# Patient Record
Sex: Male | Born: 1935 | Race: White | Hispanic: No | Marital: Married | Smoking: Never smoker
Health system: Southern US, Community
[De-identification: ages and names within clinical notes are randomized; demographics above are authoritative.]

## PROBLEM LIST (undated history)

## (undated) DIAGNOSIS — M47812 Spondylosis without myelopathy or radiculopathy, cervical region: Secondary | ICD-10-CM

## (undated) DIAGNOSIS — Z79891 Long term (current) use of opiate analgesic: Secondary | ICD-10-CM

## (undated) DIAGNOSIS — I2510 Atherosclerotic heart disease of native coronary artery without angina pectoris: Secondary | ICD-10-CM

## (undated) DIAGNOSIS — G894 Chronic pain syndrome: Secondary | ICD-10-CM

## (undated) DIAGNOSIS — IMO0001 Reserved for inherently not codable concepts without codable children: Secondary | ICD-10-CM

## (undated) DIAGNOSIS — I480 Paroxysmal atrial fibrillation: Secondary | ICD-10-CM

## (undated) DIAGNOSIS — E785 Hyperlipidemia, unspecified: Secondary | ICD-10-CM

## (undated) DIAGNOSIS — I4891 Unspecified atrial fibrillation: Secondary | ICD-10-CM

## (undated) DIAGNOSIS — M5030 Other cervical disc degeneration, unspecified cervical region: Secondary | ICD-10-CM

## (undated) DIAGNOSIS — D46A Refractory cytopenia with multilineage dysplasia: Secondary | ICD-10-CM

## (undated) DIAGNOSIS — D469 Myelodysplastic syndrome, unspecified: Secondary | ICD-10-CM

## (undated) DIAGNOSIS — I10 Essential (primary) hypertension: Secondary | ICD-10-CM

## (undated) DIAGNOSIS — D61818 Other pancytopenia: Secondary | ICD-10-CM

## (undated) HISTORY — DX: Myelodysplastic syndrome, unspecified: D46.9

## (undated) HISTORY — DX: Atherosclerotic heart disease of native coronary artery without angina pectoris: I25.10

## (undated) HISTORY — DX: Other cervical disc degeneration, unspecified cervical region: M50.30

## (undated) HISTORY — PX: CATARACT EXTRACTION: SUR2

## (undated) HISTORY — PX: HIP SURGERY: SHX245

## (undated) HISTORY — DX: Essential (primary) hypertension: I10

## (undated) HISTORY — DX: Hyperlipidemia, unspecified: E78.5

## (undated) HISTORY — DX: Chronic pain syndrome: G89.4

## (undated) HISTORY — DX: Other pancytopenia: D61.818

## (undated) HISTORY — DX: Paroxysmal atrial fibrillation: I48.0

## (undated) HISTORY — DX: Refractory cytopenia with multilineage dysplasia: D46.A

## (undated) HISTORY — PX: KNEE SURGERY: SHX244

## (undated) HISTORY — DX: Reserved for inherently not codable concepts without codable children: IMO0001

## (undated) HISTORY — DX: Unspecified atrial fibrillation: I48.91

## (undated) HISTORY — DX: Long term (current) use of opiate analgesic: Z79.891

## (undated) HISTORY — DX: Spondylosis without myelopathy or radiculopathy, cervical region: M47.812

## (undated) HISTORY — PX: CHOLECYSTECTOMY: SHX55

---

## 1997-01-03 HISTORY — PX: CARDIAC CATHETERIZATION: SHX172

## 2005-03-08 ENCOUNTER — Inpatient Hospital Stay (HOSPITAL_COMMUNITY): Admission: RE | Admit: 2005-03-08 | Discharge: 2005-03-11 | Payer: Self-pay | Admitting: Cardiovascular Disease

## 2005-03-09 ENCOUNTER — Ambulatory Visit: Payer: Self-pay | Admitting: Internal Medicine

## 2005-03-21 ENCOUNTER — Ambulatory Visit: Payer: Self-pay | Admitting: Oncology

## 2005-03-22 ENCOUNTER — Other Ambulatory Visit: Admission: RE | Admit: 2005-03-22 | Discharge: 2005-03-22 | Payer: Self-pay | Admitting: Oncology

## 2005-03-22 ENCOUNTER — Encounter (INDEPENDENT_AMBULATORY_CARE_PROVIDER_SITE_OTHER): Payer: Self-pay | Admitting: Oncology

## 2005-03-30 ENCOUNTER — Ambulatory Visit: Payer: Self-pay | Admitting: Hematology and Oncology

## 2005-05-09 ENCOUNTER — Ambulatory Visit: Payer: Self-pay | Admitting: Oncology

## 2005-06-28 ENCOUNTER — Ambulatory Visit: Payer: Self-pay | Admitting: Oncology

## 2005-07-27 ENCOUNTER — Ambulatory Visit: Payer: Self-pay | Admitting: Oncology

## 2005-08-18 ENCOUNTER — Ambulatory Visit: Payer: Self-pay | Admitting: Oncology

## 2005-10-10 ENCOUNTER — Ambulatory Visit: Payer: Self-pay | Admitting: Oncology

## 2005-12-05 ENCOUNTER — Ambulatory Visit: Payer: Self-pay | Admitting: Oncology

## 2005-12-09 ENCOUNTER — Ambulatory Visit: Payer: Self-pay | Admitting: Oncology

## 2006-01-30 ENCOUNTER — Ambulatory Visit: Payer: Self-pay | Admitting: Oncology

## 2006-06-20 ENCOUNTER — Other Ambulatory Visit: Admission: RE | Admit: 2006-06-20 | Discharge: 2006-06-20 | Payer: Self-pay | Admitting: Oncology

## 2006-06-20 ENCOUNTER — Encounter (INDEPENDENT_AMBULATORY_CARE_PROVIDER_SITE_OTHER): Payer: Self-pay | Admitting: Oncology

## 2006-07-13 ENCOUNTER — Ambulatory Visit: Payer: Self-pay | Admitting: Oncology

## 2006-09-29 ENCOUNTER — Ambulatory Visit: Payer: Self-pay | Admitting: Oncology

## 2007-03-04 HISTORY — PX: CARDIAC CATHETERIZATION: SHX172

## 2007-11-12 IMAGING — CR DG CHEST 2V
2 series · 2 of 2 positions shown · non-contrast
Comparison: none

CLINICAL DATA: Pre-cardiac catheterization.  Shortness of breath.  Chest tightness.  Abnormal EKG.  History of pneumonia in [REDACTED].  
 CHEST - 2 VIEW:

[view not recorded (1 of 2)]
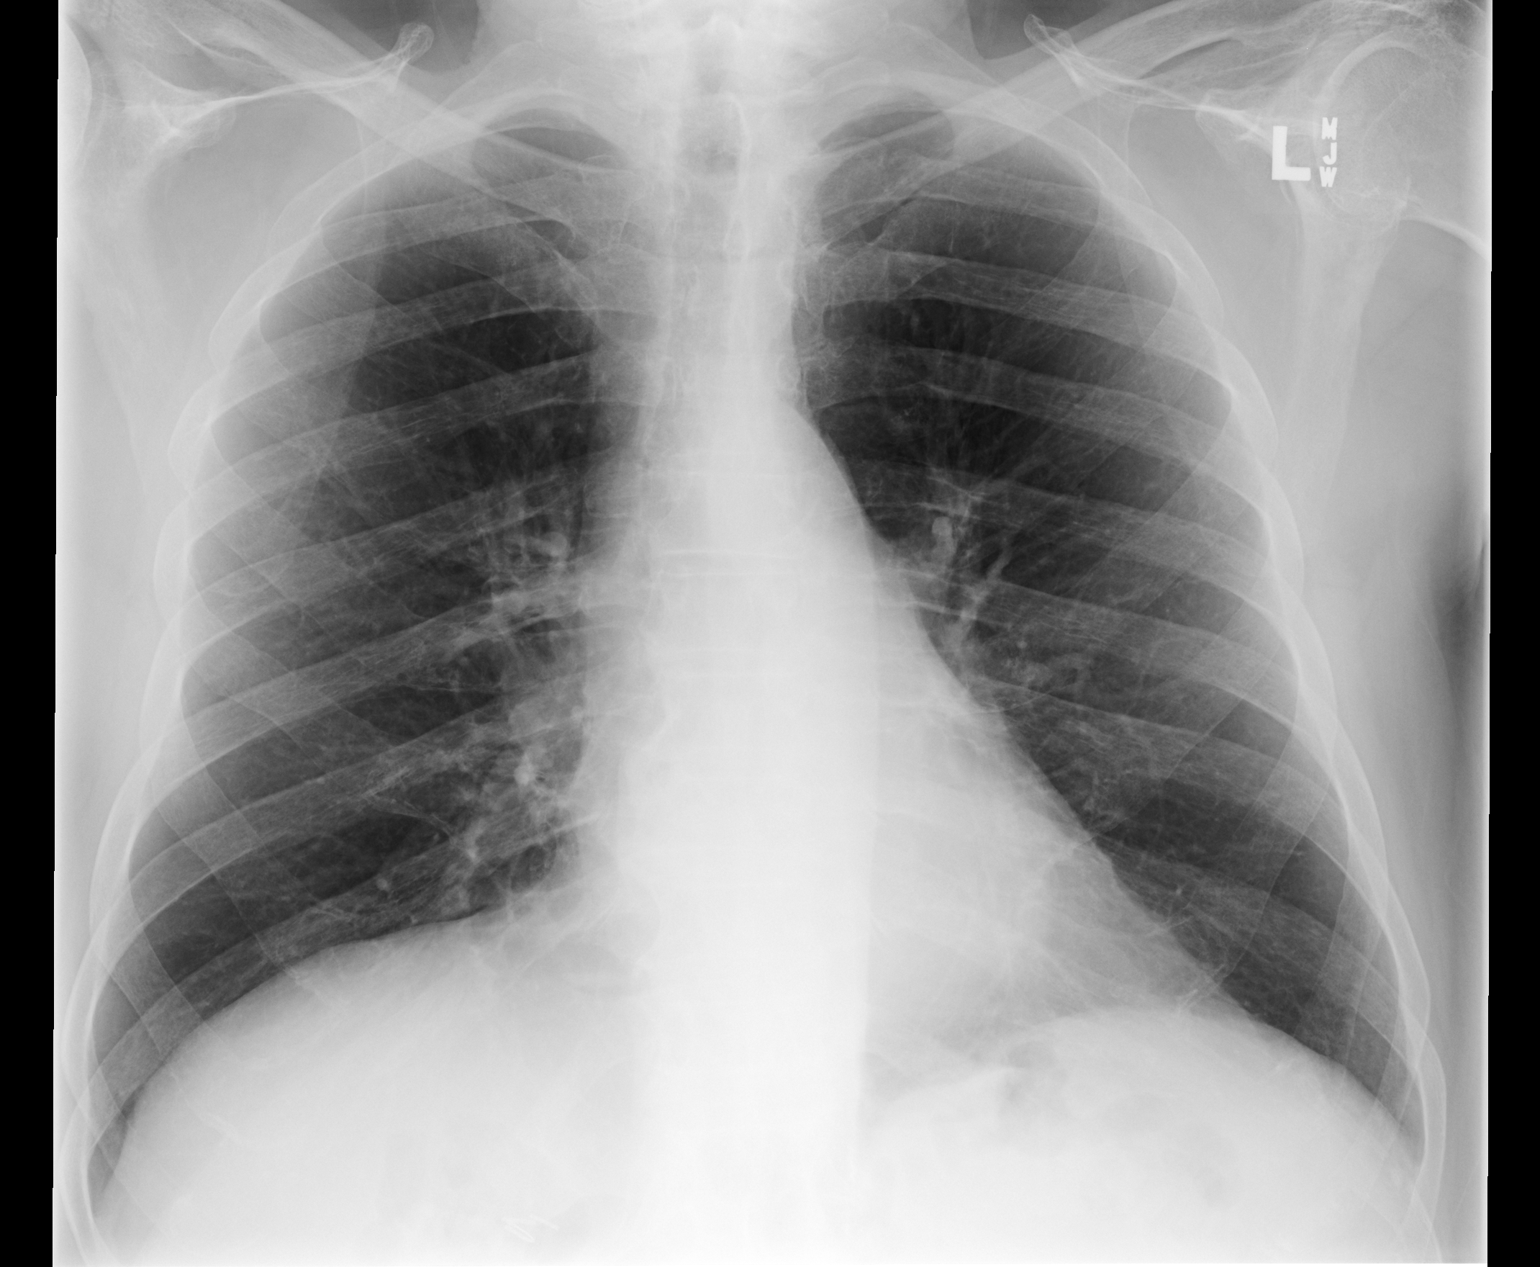

[view not recorded (2 of 2)]
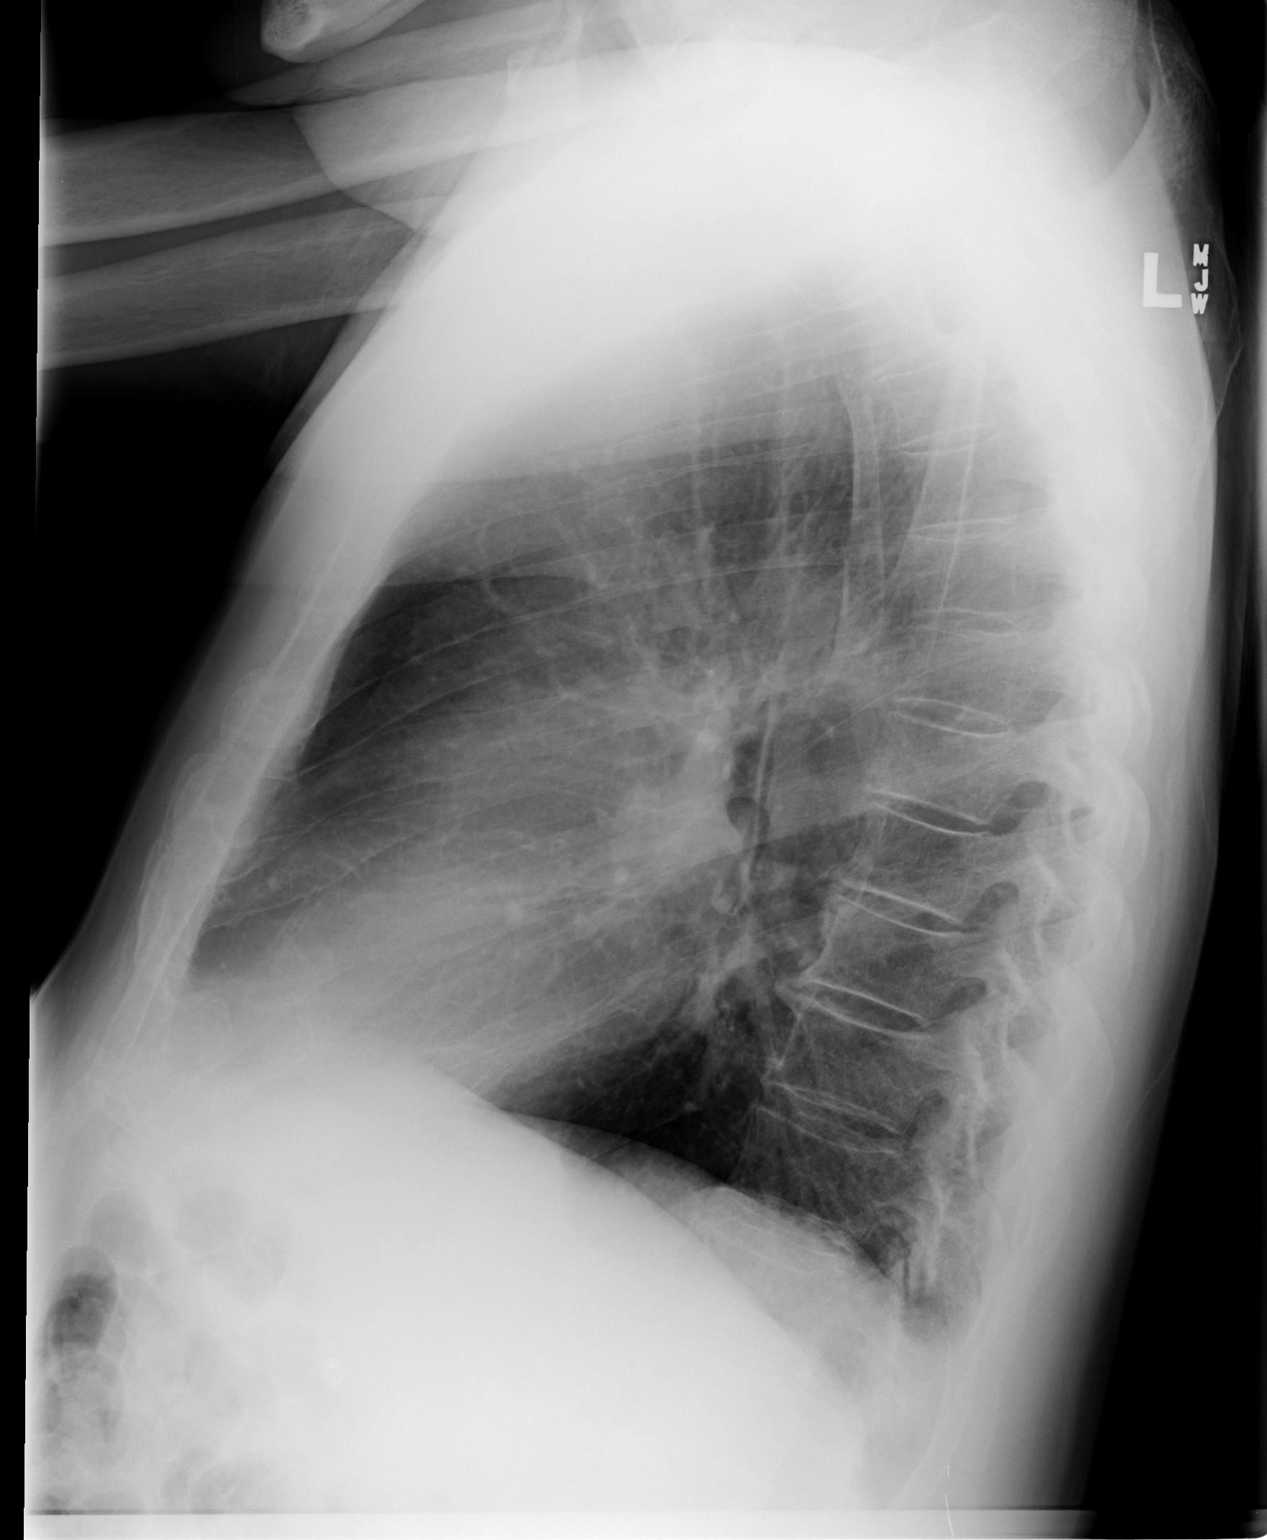

[2 of 2 positions shown; findings below may reference images not displayed]

FINDINGS: Midline trachea.  Heart size normal.  Mediastinal contours unremarkable.  Lungs clear.  No pleural effusion.  Mild thoracic spondylosis.
IMPRESSION: No acute cardiopulmonary disease.

## 2010-05-21 NOTE — Consult Note (Signed)
NAMEKIMOTHY, KISHIMOTO            ACCOUNT NO.:  [id number]   MEDICAL RECORD NO.:  [id number]          PATIENT TYPE:  INP   LOCATION:  NA                           FACILITY:  [facility]   PHYSICIAN:  Lauretta I. Odogwu, M.D.DATE OF BIRTH:  May 11, 1925   DATE OF CONSULTATION:  03/10/2005  DATE OF DISCHARGE:                                   CONSULTATION   REASON FOR CONSULTATION:  Patient with macrocytic anemia who is due to  undergo cardiac catheterization.   FINDINGS:  The patient is a 77 year old gentleman who was admitted routinely  for a cardiac catheterization.  He presented with syncopal episodes and  chest tightness.  An admitting head CT scan is negative.  The patient denied  a history of overt blood loss through his GI and GU tracts.  He has never  had bruising or petechiae.  He eats a well balanced diet.  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  His admission hemoglobin was 7.1 with an  MCV of 108.  He was subsequently transfused 2 units of packed red blood  cells with an increase of his hemoglobin to 10.2.  He denies a history of  previous transfusions.  The patient underwent an upper and lower endoscopy  on March 09, 2005, which was essentially negative.  Heme workup so far notes the following results: (Obtained prior to  transfusion);  Iron 188, TIBC 318, percent saturation 59%, ferritin 216,  vitamin B12 400.   PAST MEDICAL HISTORY:  1.  Hypertension.  2.  Coronary artery disease with myocardial infarction in 1999 status post      PCI with stents, cath in January 2006.  3.  History of degenerative joint disease.  4.  History of peptic ulcer disease in 2001.   MEDICATIONS:  Toprol XL, Lipitor, Lotensin, aspirin, nitroglycerin,  Claritin.   ALLERGIES:  The patient is allergic to PENICILLIN, MORPHINE, AND TETANUS.   SOCIAL HISTORY:  The patient is married with two children.  Denies history  of alcohol or tobacco use.   FAMILY HISTORY:  Negative for anemia or  oncologic malignancies.   REVIEW OF SYSTEMS:  CONSTITUTIONAL:  Denies fevers, chills, night sweats,  anorexia, or weight loss.  CARDIOVASCULAR:  Denies chest pain, PND,  orthopnea, ankle swelling.  RESPIRATORY:  Denies cough, hemoptysis, wheeze,  shortness of breath.  GI:  Denies nausea, vomiting, abdominal pain,  diarrhea, constipation, or melena.  GU:  Denies dysuria or hematuria.  SKIN:  Denies bruising or petechiae.  MUSCULOSKELETAL:  Admits to joint ache, but  denies muscle tenderness.  NEUROLOGICAL:  Denies headache, vision change,  extremity weakness.   PHYSICAL EXAMINATION:  GENERAL:  The patient is a well appearing, well nourished man in no  distress.  He is alert and oriented x 3.  VITAL SIGNS:  Pulse 86, blood pressure 120/70, temperature 99.2,  respirations 20, saturations 97% on room air.  HEENT:  Head is normocephalic, atraumatic.  Extraocular movements intact.  Sclerae anicteric.  Pupils equal, round, reactive to light.  Mouth moist  without ulcerations, thrush, or lesions.  There is no glossitis.  NECK:  Supple without thyromegaly and trachea was midline.  LUNGS:  Good air entry bilaterally and is clear to auscultation and  percussion.  HEART:  First and second heart sounds present with no added sounds or  murmurs.  ABDOMEN:  Soft, nontender.  There was no hepatosplenomegaly and bowel sounds  were present.  LYMPH NODES:  No palpable cervical, axillary, inguinal adenopathy.  EXTREMITIES:  No edema, pulses were present and symmetric.  NEUROLOGICAL:  No focal deficits.  Plantars downgoing.  Reflexes were  present and symmetrical.   LABORATORY DATA:  Please see above.  In addition, review of peripheral smear  demonstrated no schistocytes.  Red blood cells slightly enlarged.  White  blood cells and platelets were normal morphology.  CBC March 10, 2005, white  cell count 5.1, hemoglobin 10.2, hematocrit 28.7, platelets 317, MCV 100.  On admission, sodium 138, potassium  3.9, chloride 107, CO2 24, BUN 18,  creatinine 1.1, glucose 101, total bilirubin 1.1, alkaline phos 62, AST 24,  ALT 17, total protein 6.  TSH normal at 1.53.  Urinalysis negative.   IMPRESSION:  AND PLAN  9.  77 year old gentleman with no smoking history or alcohol use with      macrocytic anemia. He is vitamin B12 deficient (vitamin B12 is low      normal).  He may have early myelodysplastic syndrome.  Would therefore      recommend checking erythropoietin levels.  The patient may benefit from      vitamin B12, and will be given 1000 mcg subcu today. He will definitely      require a bone marrow biopsy and this can be arranged and performed as      an outpatient once he has been discharged.  2.  Coronary artery disease.  The patient is due for cardiac catheterization      in the morning.  Drugs such as Plavix, Angiostat, 2B-3A inhibitors are      not contraindicated in the setting of macrocytic anemia.  The patient      has been transfused with adequate increase in his hemoglobin and      hematocrit.   Thank you for allowing me to participate in the patient's care.  Please call  for questions.      Lauretta I. Odogwu, M.D.  Electronically Signed     LIO/MEDQ  D:  03/10/2005  T:  03/10/2005  Job:  [id number]

## 2010-05-21 NOTE — Cardiovascular Report (Signed)
[name], [name]            ACCOUNT NO.:  [id number]   MEDICAL RECORD NO.:  [id number]          PATIENT TYPE:  INP   LOCATION:  2033                         FACILITY:  [facility]   PHYSICIAN:  Lance [name], M.D.DATE OF BIRTH:  1925-04-18   DATE OF PROCEDURE:  03/11/2005  DATE OF DISCHARGE:  03/11/2005                              CARDIAC CATHETERIZATION   PROCEDURE:  Retrograde central aortic catheterization, selective coronary  angiography via Judkins technique, LV angiogram in RAO and LAO projection,  abdominal aortic angiogram midstream PA projection.   PROCEDURE:  The patient brought to the second floor CP Lab room number 7 in  a post absorptive state after 5 mg Valium p.o. premedication. Informed  consent was obtained from the patient's family to proceed with the  procedure.  The right groin prepped and draped in usual manner. 1% Xylocaine  was used for local anesthesia.  The RCFA was entered with a single anterior  puncture using an 18 thin wall needle with modified Seldinger technique and  a 0.035 Teflon coated J-tip guide-wire for insertion of a 6 [place] short  Daig sidearm sheath.  Catheterization was done with 6 French 4 cm taper  preformed Cordis, coronary, and pigtail catheters. LV angiogram was done in  the RAO and LAO projection, 25 mL at 14 mL per second, 20 mL at 12 mL per  second. Pullback pressure at the CA showed no gradient across the aortic  valve. Abdominal aortic angiogram was done with DSA and mid stream PA  projection, at 20 mL 20 mL per second. The catheter was removed, sidearm  sheath was flushed, cineangiograms were reviewed. The patient tolerated the  procedure well. He is transferred to the holding area for sheath removal  with pressure hemostasis in stable condition.   PRESSURES:  LV:  105/0; LVEDP 16 mmHg.  CA:  105/5, 50 mmHg.  No gradient  was aortic valve on catheter pullback.   Fluoroscopy showed 2+ calcification of the proximal third  of the LAD and  proximal portion of the circumflex that was nonobstructive. The bare metal  stent in the mid RCA was easily visualized.   The main left coronary was normal.   The LAD had irregularities in the proximal and mid third but had no  significant stenosis and coursed to the apex of the heart where it  bifurcated. It was a moderate size vessel.   The first diagonal arose before SP1, bifurcated, was thin, had  irregularities but no significant angiographic stenosis.   The second diagonal arose after SP1 at the junction of the proximal third of  the LAD, bifurcated, but had no significant stenosis.   There was a moderate sized optional diagonal vessel (trifurcation) and had  tandem 75-80 and 80-85% lesions in the midportion. It was tortuous in the  distal third and bifurcated with good flow.   The circumflex was nondominant and gave off a moderate sized distal marginal  and the distal proper circumflex and PABG branch, it bifurcated and it was  widely patent with no stenosis.   The right coronary was a dominant vessel.  The PDA  arose at the acute  margin.  The PLA bifurcated, gave off an AV node branch, and was normal.  The midportion had a previously placed bare metal stent with less than 20-  30% narrowing, excellent flow, and excellent residual lumen. The proximal  vessel had a conus left atrial and RV branch, all of which were normal.   Abdominal aortic angiogram midstream PA projection showed fairly normal  infrarenal abdominal aorta, single renal arteries, patent celiac and SMA and  IMA axis. The common iliacs had no significant stenosis. The hypogastrics  were intact bilaterally and proximal external iliacs appeared normal with  good runoff. There was no aneurysm or stenosis.  The renal arteries were  single and normal.   DISCUSSION:  Lance [name] is a 77 year old married father of two with three  grandchildren. He is a nonsmoker. He has known coronary disease dating  back  to 1999 when he had an out of hospital DMI. He underwent intervention on  January 08, 1997, for subtotal mid right coronary stenosis and thrombosis. He  had a bare metal 3.5/22 stent placed to the mid RCA and POBA of tandem PLA  lesions upgraded to a 3/5 balloon with good angiographic result. He had 50  and 80-90% tandem OD lesions at that time and was treated medically long  term with good LV function and minor inferior wall motion abnormality. He is  now retired from [employer] which his son does but he is active and he  has had recent dyspnea on exertion, exercise tolerance, and exertional  angina prompting admission to the hospital on March 08, 2005.  He was found  to have a macrocytic anemia with heme negative stools, no significant  history of GI bleeding or upper GI symptoms. A GI consultation was performed  since he has had a history of remote peptic ulcer. He had the essentially  upper and lower endoscopy. Hematology consultation was subsequently obtained  and it was felt that he had macrocytic anemia, probably not related to blood  loss. It was recommended he have a course of B12 100 mcg subcu monthly for  borderline low B12 and that he have outpatient bone marrow for further  evaluation to rule out any myelodysplastic syndrome. He was transfused 2  units in the hospital prior to his catheterization.   I recommend continued medical therapy of his residual coronary disease which  is essentially his optional diagonal disease. Continue workup of his  macrocytic anemia as outlined. Should he develop symptoms of angina, his  tandem optional diagonal lesions would appear to be amendable  angiographically to PCI if clinically indicated.   CATHETERIZATION DIAGNOSIS:  1.  Angina, probably secondary related to anemia.  2.  Macrocytic anemia, workup in progress.  3.  Coronary artery disease with prior out of hospital SED MI and PCI     January 08, 1997, mid RCA bare metal  stenting PLA and PLBA tandem      lesions, no restenosis on this restudy.  4.  Normal LV function.  5.  Normal renal arteries.  6.  Macrocytic anemia requiring transfusion.  7.  Degenerate joint disease.  8.  Remote history of peptic ulcer disease.  9.  Negative upper and lower endoscopy on this admission.      Lance [name], M.D.  Electronically Signed     RAW/MEDQ  D:  03/11/2005  T:  03/11/2005  Job:  [id number]   cc:   Dr. Ivor Costa I. Odogwu,  M.D.  Fax: [phone]   Wilhemina Bonito. Marina Goodell, M.D. [facility]  [address]  [place]  [place] [address]

## 2010-05-21 NOTE — Discharge Summary (Signed)
NAMEHUSSEIN, Lance [name]            ACCOUNT NO.:  [id number]   MEDICAL RECORD NO.:  [id number]          PATIENT TYPE:  INP   LOCATION:  2033                         FACILITY:  [facility]   PHYSICIAN:  Richard A. Alanda Amass, M.D.DATE OF BIRTH:  09-Nov-1925   DATE OF ADMISSION:  03/08/2005  DATE OF DISCHARGE:  03/11/2005                                 DISCHARGE SUMMARY   DISCHARGE DIAGNOSES:  1.  Chest pressure, unstable angina.  2.  Macrocytic anemia with acute anemia on admission receiving two units of      packed cells.  3.  Syncope with heart rate dropping to 39.  4.  Coronary artery disease, with history of previous stent, currently      stable coronary disease.  5.  Vitamin B12 deficiency.  6.  Sigmoid diverticulosis.  7.  History of degenerative joint disease.  8.  History of peptic ulcer disease.   DISCHARGE CONDITION:  Stable.   DISCHARGE MEDICATIONS:  1.  Zyrtec as needed.  2.  Lipitor 40 mg daily.  3.  Lotensin 20 mg daily.  4.  Aspirin 81 mg daily.  5.  Toprol XL 25 mg daily.  6.  B12 injection 1000 mg subcu monthly; take the vial of medications to Dr.      Erroll Luna for injection.  7.  Nitroglycerin sublingual p.r.n. chest pain.   DISCHARGE INSTRUCTIONS:  1.  Low-fat, low-salt diet.  2.  Wash right groin cath site with soap and water; to call if any bleeding,      swelling, or drainage.  3.  Follow up with Dr. Alanda Amass on March 25, 2005, at 10:30 a.m.  4.  Follow up with Dr. Manson Passey in [place], to call for that appointment.  5.  Have labwork done on Monday, March 14, 2005.  6.  Have monitor placement at 11 a.m. on the 2nd floor of Dr. Kandis Cocking      office.  7.  Follow up with hematology for bone marrow biopsy with Dr. Dalene Carrow.   HISTORY OF PRESENT ILLNESS:  A 77 year old male patient of Dr. Alanda Amass was  seen in the office on March 07, 2005, with plans for admission on March 08, 2005, for cardiac catheterization. He has a history of coronary artery  disease and in  the three to four months prior to admission he had dyspnea,  chest tightness and fullness particularly in the cold weather. He had not  used any nitroglycerin and did not consider this pain with now radiation. He  would rest and symptoms would resolve. He has associated dyspnea on exertion  which is also new and slightly progressive over the last several months,  anginal equivalent. When he walks his heart rate also increases.   In the office he denied GI bleed, syncope, or presyncope. He had a history  of bronchitis, questionable pneumonia, treated in January and in December.  He also had a recent rash prior to this admission that he was given Zyrtec  for.   EKG during the office visit was sinus tachycardia at 94, nonspecific ST  changes, and RSR prime in V1. Dr. Alanda Amass thought  the patient had  recurrent angina and due to his ACT lifestyle at 77 years of age and  significant comorbid problems he gave him Imdur and started Plavix, and  thought cardiac catheterization would be next the step. He was scheduled to  come in on March 08, 2005, and labwork was ordered. Also in the office he did  do a hemoccult which was negative.   On admitting labs hemoglobin was 7.1, hematocrit 19.1.  He was admitted,  placed on PPI, cardiac catheterization was held. GI consult was obtained.   PAST MEDICAL HISTORY:  1.  Positive for coronary artery disease. Remove out of hospital with BMI      with angioplasty and stenting of the mid RCA in January 1999 and PTCA of      the mid PLA lesion. He also had moderate disease at that time of the      lower diagonal and mild disease of the mid LAD, with four large diagonal      with mild inferior wall hypokinesis. He has done well. His last      Cardiolite was on January 23, 2004, and it did not reveal any ischemia.  2.  Degenerative joint disease.  3.  Peptic ulcer disease.  4.  Hypertension.   For family history, social history, and review of systems, see  H&P.   ALLERGIES:  MORPHINE, PENICILLIN, and TETANUS.   OUTPATIENT MEDICATIONS:  1.  Toprol 100 mg.  2.  Lipitor 40 mg daily.  3.  Aspirin 81 mg daily.  4.  Lotensin 20 mg daily.   PHYSICAL EXAMINATION:  VITAL SIGNS: At discharge blood pressure 98/54, pulse  in the 90s and stable,  GENERAL: Alert and oriented male in no acute distress.  SKIN: Warm and dry.  LUNGS: Clear.  HEART: S1 and S2. Regular rate and rhythm. Right groin cath site stable.   PROCEDURE:  1.  On March 10, 2005, EGD and colonoscopy by Dr. Marina Goodell. EGD was normal.      Colonoscopy revealed diverticulosis in the sigmoid colon.  2.  On March 11, 2005, left heart catheterization by Dr. Susa Griffins.      He did have residual disease. His intervention to the RCA with bare      metal stenting, no restenosis. Normal renal arteries, normal LV      function. He does have diffuse disease of his circumflex. Medical      therapy was recommended.   CONSULTATIONS:  1.  Dr. Marina Goodell, GI.  2.  Dr. Thad Ranger, neurology.  3.  Dr. Dalene Carrow, oncology.   HOSPITAL COURSE:  The patient was admitted by Dr. Alanda Amass. On arrival to  the hospital he was found to have a hemoglobin of 7.1. He was transfused two  units. The cardiac catheterization which had been planned for anginal  equivalent was postponed. Also, on the same day of admission he developed  sudden lightheadedness, he stated that he felt like his heart had stopped,  and his eyes were rolled back in his head. He was sitting in a chair so he  did not injure himself. Family was with him at the time.   Heart rate did drop down to 39 during his procedure. Therefore, his Toprol  was cut back.   A neurologic consult was obtained who felt it was more related to his  bradycardia and his anemia. A GI consult was obtained with Dr. Marina Goodell. The  patient underwent EGD and colonoscopy and no obvious GI source for  his anemia. Oncology consult was obtained and the patient will be scheduled  for  a bone marrow biopsy as an outpatient. The patient was stable though to  undergo cardiac catheterization on March 11, 2005,  which he did with results  as previously stated. The EF was 60%. The patient tolerated that procedure  and was ready for discharge on the evening of March 11, 2005, and was  discharged home. He would follow up as an outpatient.   LABORATORY DATA:  First he had a carotid duplex study on March 09, 2005, with  right ICA stenosis of 40% to 60%, left ICA stenosis of 40% to 60%, bilateral  vertebral artery flow was antegrade. EKG revealed sinus rhythm, no obvious  changes.   CT of the head revealed chronic changes of atrophy, atherosclerosis, and  microvascular ischemic disease. No acute findings. PA and lateral chest x-  ray with no acute cardiopulmonary process. Serum protein electrophoresis was  of normal pattern.   Hemoglobin on admission 7.1, hematocrit 19.9, WBC 3.9,  platelet count  358,000. Two units of packed cells given. At discharge hemoglobin 9.1,  hematocrit 26.2, WBC 4.2, and platelet count 336, 000.   Protime 14.1, INR 1.1, PTT 26.   Chemistry reveals sodium of 139, potassium 4.3, chloride 109, CO2 23,  glucose 126, BUN 18, creatinine 1.1, calcium 9. This remained fairly stable.  AST 24, ALT 17, ALP 62, total bilirubin 1.1.   Cardiac enzymes were negative.  CK of 67 and 107, MB of 1.3 to 1.7, troponin-  I of 0.01 to 0.02, negative MI.   TSH 1.532. Iron 188, TIBC 318, iron saturation 59%, UIBC 130. B12 400.  Folate greater than 20, ferritin 316. CEA 0.9, PSA 3.91. Erythropoietin was  elevated at 182. UA was clear.   The patient will follow up his instructions.      Darcella Gasman. [place], N.P.      Richard A. Alanda Amass, M.D.  Electronically Signed    LRI/MEDQ  D:  04/18/2005  T:  04/18/2005  Job:  [id number]   cc:   Gerlene Burdock A. Alanda Amass, M.D.  Fax: [phone]   Dr. Manson Passey  [facility]. Marina Goodell, M.D. [facility]  [address]  [place]  [place] [address]   Marolyn Hammock. Thad Ranger, M.D.  Fax: [phone]   Lauretta I. Odogwu, M.D.  Fax: [phone]

## 2011-02-28 DIAGNOSIS — D4620 Refractory anemia with excess of blasts, unspecified: Secondary | ICD-10-CM | POA: Diagnosis not present

## 2011-05-19 DIAGNOSIS — Z79899 Other long term (current) drug therapy: Secondary | ICD-10-CM | POA: Diagnosis not present

## 2011-05-19 DIAGNOSIS — E785 Hyperlipidemia, unspecified: Secondary | ICD-10-CM | POA: Diagnosis not present

## 2011-06-03 DIAGNOSIS — I739 Peripheral vascular disease, unspecified: Secondary | ICD-10-CM | POA: Diagnosis not present

## 2011-06-03 DIAGNOSIS — I2510 Atherosclerotic heart disease of native coronary artery without angina pectoris: Secondary | ICD-10-CM | POA: Diagnosis not present

## 2011-06-03 DIAGNOSIS — E782 Mixed hyperlipidemia: Secondary | ICD-10-CM | POA: Diagnosis not present

## 2011-06-09 DIAGNOSIS — I6529 Occlusion and stenosis of unspecified carotid artery: Secondary | ICD-10-CM | POA: Diagnosis not present

## 2011-06-09 DIAGNOSIS — I2510 Atherosclerotic heart disease of native coronary artery without angina pectoris: Secondary | ICD-10-CM | POA: Diagnosis not present

## 2011-07-01 DIAGNOSIS — H2589 Other age-related cataract: Secondary | ICD-10-CM | POA: Diagnosis not present

## 2011-07-18 DIAGNOSIS — S139XXA Sprain of joints and ligaments of unspecified parts of neck, initial encounter: Secondary | ICD-10-CM | POA: Diagnosis not present

## 2011-07-18 DIAGNOSIS — M9981 Other biomechanical lesions of cervical region: Secondary | ICD-10-CM | POA: Diagnosis not present

## 2011-07-19 DIAGNOSIS — M9981 Other biomechanical lesions of cervical region: Secondary | ICD-10-CM | POA: Diagnosis not present

## 2011-07-19 DIAGNOSIS — S139XXA Sprain of joints and ligaments of unspecified parts of neck, initial encounter: Secondary | ICD-10-CM | POA: Diagnosis not present

## 2011-07-20 DIAGNOSIS — S139XXA Sprain of joints and ligaments of unspecified parts of neck, initial encounter: Secondary | ICD-10-CM | POA: Diagnosis not present

## 2011-07-20 DIAGNOSIS — M9981 Other biomechanical lesions of cervical region: Secondary | ICD-10-CM | POA: Diagnosis not present

## 2011-07-21 DIAGNOSIS — S139XXA Sprain of joints and ligaments of unspecified parts of neck, initial encounter: Secondary | ICD-10-CM | POA: Diagnosis not present

## 2011-07-21 DIAGNOSIS — M9981 Other biomechanical lesions of cervical region: Secondary | ICD-10-CM | POA: Diagnosis not present

## 2011-07-25 DIAGNOSIS — S139XXA Sprain of joints and ligaments of unspecified parts of neck, initial encounter: Secondary | ICD-10-CM | POA: Diagnosis not present

## 2011-07-25 DIAGNOSIS — M9981 Other biomechanical lesions of cervical region: Secondary | ICD-10-CM | POA: Diagnosis not present

## 2011-07-28 DIAGNOSIS — M9981 Other biomechanical lesions of cervical region: Secondary | ICD-10-CM | POA: Diagnosis not present

## 2011-07-28 DIAGNOSIS — S139XXA Sprain of joints and ligaments of unspecified parts of neck, initial encounter: Secondary | ICD-10-CM | POA: Diagnosis not present

## 2011-08-01 DIAGNOSIS — S139XXA Sprain of joints and ligaments of unspecified parts of neck, initial encounter: Secondary | ICD-10-CM | POA: Diagnosis not present

## 2011-08-01 DIAGNOSIS — M9981 Other biomechanical lesions of cervical region: Secondary | ICD-10-CM | POA: Diagnosis not present

## 2011-08-04 DIAGNOSIS — M9981 Other biomechanical lesions of cervical region: Secondary | ICD-10-CM | POA: Diagnosis not present

## 2011-08-04 DIAGNOSIS — S139XXA Sprain of joints and ligaments of unspecified parts of neck, initial encounter: Secondary | ICD-10-CM | POA: Diagnosis not present

## 2011-08-10 DIAGNOSIS — M9981 Other biomechanical lesions of cervical region: Secondary | ICD-10-CM | POA: Diagnosis not present

## 2011-08-10 DIAGNOSIS — S139XXA Sprain of joints and ligaments of unspecified parts of neck, initial encounter: Secondary | ICD-10-CM | POA: Diagnosis not present

## 2011-08-15 DIAGNOSIS — Z23 Encounter for immunization: Secondary | ICD-10-CM | POA: Diagnosis not present

## 2011-08-15 DIAGNOSIS — M79609 Pain in unspecified limb: Secondary | ICD-10-CM | POA: Diagnosis not present

## 2011-08-15 DIAGNOSIS — M9981 Other biomechanical lesions of cervical region: Secondary | ICD-10-CM | POA: Diagnosis not present

## 2011-08-15 DIAGNOSIS — S139XXA Sprain of joints and ligaments of unspecified parts of neck, initial encounter: Secondary | ICD-10-CM | POA: Diagnosis not present

## 2011-08-19 DIAGNOSIS — M79609 Pain in unspecified limb: Secondary | ICD-10-CM | POA: Diagnosis not present

## 2011-08-22 DIAGNOSIS — M9981 Other biomechanical lesions of cervical region: Secondary | ICD-10-CM | POA: Diagnosis not present

## 2011-08-22 DIAGNOSIS — S139XXA Sprain of joints and ligaments of unspecified parts of neck, initial encounter: Secondary | ICD-10-CM | POA: Diagnosis not present

## 2011-08-29 DIAGNOSIS — S139XXA Sprain of joints and ligaments of unspecified parts of neck, initial encounter: Secondary | ICD-10-CM | POA: Diagnosis not present

## 2011-08-29 DIAGNOSIS — M9981 Other biomechanical lesions of cervical region: Secondary | ICD-10-CM | POA: Diagnosis not present

## 2011-09-02 DIAGNOSIS — D4620 Refractory anemia with excess of blasts, unspecified: Secondary | ICD-10-CM | POA: Diagnosis not present

## 2011-09-12 DIAGNOSIS — S139XXA Sprain of joints and ligaments of unspecified parts of neck, initial encounter: Secondary | ICD-10-CM | POA: Diagnosis not present

## 2011-09-12 DIAGNOSIS — M999 Biomechanical lesion, unspecified: Secondary | ICD-10-CM | POA: Diagnosis not present

## 2011-09-12 DIAGNOSIS — M9981 Other biomechanical lesions of cervical region: Secondary | ICD-10-CM | POA: Diagnosis not present

## 2011-09-26 DIAGNOSIS — M999 Biomechanical lesion, unspecified: Secondary | ICD-10-CM | POA: Diagnosis not present

## 2011-09-26 DIAGNOSIS — M9981 Other biomechanical lesions of cervical region: Secondary | ICD-10-CM | POA: Diagnosis not present

## 2011-09-26 DIAGNOSIS — S139XXA Sprain of joints and ligaments of unspecified parts of neck, initial encounter: Secondary | ICD-10-CM | POA: Diagnosis not present

## 2011-09-30 DIAGNOSIS — L84 Corns and callosities: Secondary | ICD-10-CM | POA: Diagnosis not present

## 2011-09-30 DIAGNOSIS — B359 Dermatophytosis, unspecified: Secondary | ICD-10-CM | POA: Diagnosis not present

## 2011-09-30 DIAGNOSIS — J209 Acute bronchitis, unspecified: Secondary | ICD-10-CM | POA: Diagnosis not present

## 2011-09-30 DIAGNOSIS — L02619 Cutaneous abscess of unspecified foot: Secondary | ICD-10-CM | POA: Diagnosis not present

## 2011-09-30 DIAGNOSIS — L03039 Cellulitis of unspecified toe: Secondary | ICD-10-CM | POA: Diagnosis not present

## 2011-09-30 DIAGNOSIS — M2040 Other hammer toe(s) (acquired), unspecified foot: Secondary | ICD-10-CM | POA: Diagnosis not present

## 2011-10-03 DIAGNOSIS — M999 Biomechanical lesion, unspecified: Secondary | ICD-10-CM | POA: Diagnosis not present

## 2011-10-03 DIAGNOSIS — S139XXA Sprain of joints and ligaments of unspecified parts of neck, initial encounter: Secondary | ICD-10-CM | POA: Diagnosis not present

## 2011-10-03 DIAGNOSIS — M9981 Other biomechanical lesions of cervical region: Secondary | ICD-10-CM | POA: Diagnosis not present

## 2011-10-04 DIAGNOSIS — M999 Biomechanical lesion, unspecified: Secondary | ICD-10-CM | POA: Diagnosis not present

## 2011-10-04 DIAGNOSIS — S139XXA Sprain of joints and ligaments of unspecified parts of neck, initial encounter: Secondary | ICD-10-CM | POA: Diagnosis not present

## 2011-10-04 DIAGNOSIS — Z23 Encounter for immunization: Secondary | ICD-10-CM | POA: Diagnosis not present

## 2011-10-04 DIAGNOSIS — M9981 Other biomechanical lesions of cervical region: Secondary | ICD-10-CM | POA: Diagnosis not present

## 2011-10-05 DIAGNOSIS — L84 Corns and callosities: Secondary | ICD-10-CM | POA: Diagnosis not present

## 2011-10-05 DIAGNOSIS — S139XXA Sprain of joints and ligaments of unspecified parts of neck, initial encounter: Secondary | ICD-10-CM | POA: Diagnosis not present

## 2011-10-05 DIAGNOSIS — M9981 Other biomechanical lesions of cervical region: Secondary | ICD-10-CM | POA: Diagnosis not present

## 2011-10-05 DIAGNOSIS — M2040 Other hammer toe(s) (acquired), unspecified foot: Secondary | ICD-10-CM | POA: Diagnosis not present

## 2011-10-05 DIAGNOSIS — M2010 Hallux valgus (acquired), unspecified foot: Secondary | ICD-10-CM | POA: Diagnosis not present

## 2011-10-05 DIAGNOSIS — M999 Biomechanical lesion, unspecified: Secondary | ICD-10-CM | POA: Diagnosis not present

## 2011-10-06 DIAGNOSIS — M9981 Other biomechanical lesions of cervical region: Secondary | ICD-10-CM | POA: Diagnosis not present

## 2011-10-06 DIAGNOSIS — S139XXA Sprain of joints and ligaments of unspecified parts of neck, initial encounter: Secondary | ICD-10-CM | POA: Diagnosis not present

## 2011-10-06 DIAGNOSIS — M999 Biomechanical lesion, unspecified: Secondary | ICD-10-CM | POA: Diagnosis not present

## 2011-10-10 DIAGNOSIS — S139XXA Sprain of joints and ligaments of unspecified parts of neck, initial encounter: Secondary | ICD-10-CM | POA: Diagnosis not present

## 2011-10-10 DIAGNOSIS — M9981 Other biomechanical lesions of cervical region: Secondary | ICD-10-CM | POA: Diagnosis not present

## 2011-10-10 DIAGNOSIS — M999 Biomechanical lesion, unspecified: Secondary | ICD-10-CM | POA: Diagnosis not present

## 2011-10-13 DIAGNOSIS — S139XXA Sprain of joints and ligaments of unspecified parts of neck, initial encounter: Secondary | ICD-10-CM | POA: Diagnosis not present

## 2011-10-13 DIAGNOSIS — M999 Biomechanical lesion, unspecified: Secondary | ICD-10-CM | POA: Diagnosis not present

## 2011-10-13 DIAGNOSIS — M9981 Other biomechanical lesions of cervical region: Secondary | ICD-10-CM | POA: Diagnosis not present

## 2011-10-17 DIAGNOSIS — M9981 Other biomechanical lesions of cervical region: Secondary | ICD-10-CM | POA: Diagnosis not present

## 2011-10-17 DIAGNOSIS — M999 Biomechanical lesion, unspecified: Secondary | ICD-10-CM | POA: Diagnosis not present

## 2011-10-17 DIAGNOSIS — S139XXA Sprain of joints and ligaments of unspecified parts of neck, initial encounter: Secondary | ICD-10-CM | POA: Diagnosis not present

## 2011-10-20 DIAGNOSIS — S139XXA Sprain of joints and ligaments of unspecified parts of neck, initial encounter: Secondary | ICD-10-CM | POA: Diagnosis not present

## 2011-10-20 DIAGNOSIS — M9981 Other biomechanical lesions of cervical region: Secondary | ICD-10-CM | POA: Diagnosis not present

## 2011-10-20 DIAGNOSIS — M999 Biomechanical lesion, unspecified: Secondary | ICD-10-CM | POA: Diagnosis not present

## 2011-10-26 DIAGNOSIS — S139XXA Sprain of joints and ligaments of unspecified parts of neck, initial encounter: Secondary | ICD-10-CM | POA: Diagnosis not present

## 2011-10-26 DIAGNOSIS — M999 Biomechanical lesion, unspecified: Secondary | ICD-10-CM | POA: Diagnosis not present

## 2011-10-26 DIAGNOSIS — M9981 Other biomechanical lesions of cervical region: Secondary | ICD-10-CM | POA: Diagnosis not present

## 2011-11-02 DIAGNOSIS — M999 Biomechanical lesion, unspecified: Secondary | ICD-10-CM | POA: Diagnosis not present

## 2011-11-02 DIAGNOSIS — M9981 Other biomechanical lesions of cervical region: Secondary | ICD-10-CM | POA: Diagnosis not present

## 2011-11-02 DIAGNOSIS — S139XXA Sprain of joints and ligaments of unspecified parts of neck, initial encounter: Secondary | ICD-10-CM | POA: Diagnosis not present

## 2011-11-09 DIAGNOSIS — M999 Biomechanical lesion, unspecified: Secondary | ICD-10-CM | POA: Diagnosis not present

## 2011-11-09 DIAGNOSIS — S139XXA Sprain of joints and ligaments of unspecified parts of neck, initial encounter: Secondary | ICD-10-CM | POA: Diagnosis not present

## 2011-11-09 DIAGNOSIS — M9981 Other biomechanical lesions of cervical region: Secondary | ICD-10-CM | POA: Diagnosis not present

## 2011-11-17 DIAGNOSIS — I2510 Atherosclerotic heart disease of native coronary artery without angina pectoris: Secondary | ICD-10-CM | POA: Diagnosis not present

## 2011-11-17 DIAGNOSIS — E782 Mixed hyperlipidemia: Secondary | ICD-10-CM | POA: Diagnosis not present

## 2011-12-23 ENCOUNTER — Other Ambulatory Visit (HOSPITAL_COMMUNITY): Payer: Self-pay | Admitting: Cardiovascular Disease

## 2011-12-23 DIAGNOSIS — R011 Cardiac murmur, unspecified: Secondary | ICD-10-CM

## 2012-01-06 ENCOUNTER — Ambulatory Visit (HOSPITAL_COMMUNITY)
Admission: RE | Admit: 2012-01-06 | Discharge: 2012-01-06 | Disposition: A | Discharge status: Home / Self Care | Payer: Medicare Other | Source: Ambulatory Visit | Attending: Cardiovascular Disease | Admitting: Cardiovascular Disease

## 2012-01-06 DIAGNOSIS — I252 Old myocardial infarction: Secondary | ICD-10-CM | POA: Diagnosis not present

## 2012-01-06 DIAGNOSIS — I2510 Atherosclerotic heart disease of native coronary artery without angina pectoris: Secondary | ICD-10-CM | POA: Diagnosis not present

## 2012-01-06 DIAGNOSIS — I379 Nonrheumatic pulmonary valve disorder, unspecified: Secondary | ICD-10-CM | POA: Insufficient documentation

## 2012-01-06 DIAGNOSIS — R011 Cardiac murmur, unspecified: Secondary | ICD-10-CM | POA: Diagnosis not present

## 2012-01-06 DIAGNOSIS — I079 Rheumatic tricuspid valve disease, unspecified: Secondary | ICD-10-CM | POA: Insufficient documentation

## 2012-01-06 DIAGNOSIS — I080 Rheumatic disorders of both mitral and aortic valves: Secondary | ICD-10-CM | POA: Insufficient documentation

## 2012-01-06 DIAGNOSIS — E785 Hyperlipidemia, unspecified: Secondary | ICD-10-CM | POA: Diagnosis not present

## 2012-01-06 NOTE — Progress Notes (Signed)
[facility]   2D echo completed 01/06/2012.   Cindy Samiyyah Moffa, RDCS   

## 2012-01-09 DIAGNOSIS — I10 Essential (primary) hypertension: Secondary | ICD-10-CM | POA: Diagnosis not present

## 2012-01-09 DIAGNOSIS — D469 Myelodysplastic syndrome, unspecified: Secondary | ICD-10-CM | POA: Diagnosis not present

## 2012-01-09 DIAGNOSIS — E785 Hyperlipidemia, unspecified: Secondary | ICD-10-CM | POA: Diagnosis not present

## 2012-01-09 DIAGNOSIS — I4891 Unspecified atrial fibrillation: Secondary | ICD-10-CM | POA: Diagnosis not present

## 2012-01-09 DIAGNOSIS — Z9861 Coronary angioplasty status: Secondary | ICD-10-CM | POA: Diagnosis not present

## 2012-01-09 DIAGNOSIS — Z7982 Long term (current) use of aspirin: Secondary | ICD-10-CM | POA: Diagnosis not present

## 2012-01-09 DIAGNOSIS — R002 Palpitations: Secondary | ICD-10-CM | POA: Diagnosis not present

## 2012-01-09 DIAGNOSIS — I2510 Atherosclerotic heart disease of native coronary artery without angina pectoris: Secondary | ICD-10-CM | POA: Diagnosis not present

## 2012-01-09 DIAGNOSIS — I252 Old myocardial infarction: Secondary | ICD-10-CM | POA: Diagnosis not present

## 2012-01-09 DIAGNOSIS — Z23 Encounter for immunization: Secondary | ICD-10-CM | POA: Diagnosis not present

## 2012-01-09 DIAGNOSIS — E876 Hypokalemia: Secondary | ICD-10-CM | POA: Diagnosis not present

## 2012-01-13 DIAGNOSIS — I4891 Unspecified atrial fibrillation: Secondary | ICD-10-CM | POA: Diagnosis not present

## 2012-01-13 DIAGNOSIS — R791 Abnormal coagulation profile: Secondary | ICD-10-CM | POA: Diagnosis not present

## 2012-01-13 DIAGNOSIS — I495 Sick sinus syndrome: Secondary | ICD-10-CM | POA: Diagnosis not present

## 2012-01-13 DIAGNOSIS — I2510 Atherosclerotic heart disease of native coronary artery without angina pectoris: Secondary | ICD-10-CM | POA: Diagnosis not present

## 2012-01-17 ENCOUNTER — Telehealth: Payer: Self-pay | Admitting: *Deleted

## 2012-01-17 NOTE — Telephone Encounter (Signed)
Received office notes from [facility].  Faxed notes to [facility] as pt is established with [facility].

## 2012-01-18 DIAGNOSIS — M542 Cervicalgia: Secondary | ICD-10-CM | POA: Diagnosis not present

## 2012-01-18 DIAGNOSIS — R6889 Other general symptoms and signs: Secondary | ICD-10-CM | POA: Diagnosis not present

## 2012-01-18 DIAGNOSIS — I4891 Unspecified atrial fibrillation: Secondary | ICD-10-CM | POA: Diagnosis not present

## 2012-01-18 DIAGNOSIS — R5383 Other fatigue: Secondary | ICD-10-CM | POA: Diagnosis not present

## 2012-01-18 DIAGNOSIS — R5381 Other malaise: Secondary | ICD-10-CM | POA: Diagnosis not present

## 2012-01-20 DIAGNOSIS — R791 Abnormal coagulation profile: Secondary | ICD-10-CM | POA: Diagnosis not present

## 2012-01-24 DIAGNOSIS — M5030 Other cervical disc degeneration, unspecified cervical region: Secondary | ICD-10-CM | POA: Diagnosis not present

## 2012-01-30 DIAGNOSIS — Z7901 Long term (current) use of anticoagulants: Secondary | ICD-10-CM | POA: Diagnosis not present

## 2012-01-30 DIAGNOSIS — R791 Abnormal coagulation profile: Secondary | ICD-10-CM | POA: Diagnosis not present

## 2012-01-30 DIAGNOSIS — L2089 Other atopic dermatitis: Secondary | ICD-10-CM | POA: Diagnosis not present

## 2012-01-30 DIAGNOSIS — M5030 Other cervical disc degeneration, unspecified cervical region: Secondary | ICD-10-CM | POA: Diagnosis not present

## 2012-02-06 DIAGNOSIS — M5030 Other cervical disc degeneration, unspecified cervical region: Secondary | ICD-10-CM | POA: Diagnosis not present

## 2012-02-06 DIAGNOSIS — Z79899 Other long term (current) drug therapy: Secondary | ICD-10-CM | POA: Diagnosis not present

## 2012-02-06 DIAGNOSIS — M47812 Spondylosis without myelopathy or radiculopathy, cervical region: Secondary | ICD-10-CM | POA: Diagnosis not present

## 2012-02-06 DIAGNOSIS — G894 Chronic pain syndrome: Secondary | ICD-10-CM | POA: Diagnosis not present

## 2012-02-10 DIAGNOSIS — Z7901 Long term (current) use of anticoagulants: Secondary | ICD-10-CM | POA: Diagnosis not present

## 2012-02-23 DIAGNOSIS — R791 Abnormal coagulation profile: Secondary | ICD-10-CM | POA: Diagnosis not present

## 2012-03-07 DIAGNOSIS — M5030 Other cervical disc degeneration, unspecified cervical region: Secondary | ICD-10-CM | POA: Diagnosis not present

## 2012-03-07 DIAGNOSIS — M5412 Radiculopathy, cervical region: Secondary | ICD-10-CM | POA: Diagnosis not present

## 2012-03-07 DIAGNOSIS — Z7901 Long term (current) use of anticoagulants: Secondary | ICD-10-CM | POA: Diagnosis not present

## 2012-03-13 DIAGNOSIS — D4620 Refractory anemia with excess of blasts, unspecified: Secondary | ICD-10-CM | POA: Diagnosis not present

## 2012-03-15 DIAGNOSIS — M5030 Other cervical disc degeneration, unspecified cervical region: Secondary | ICD-10-CM | POA: Diagnosis not present

## 2012-03-15 DIAGNOSIS — M47812 Spondylosis without myelopathy or radiculopathy, cervical region: Secondary | ICD-10-CM | POA: Diagnosis not present

## 2012-03-15 DIAGNOSIS — G894 Chronic pain syndrome: Secondary | ICD-10-CM | POA: Diagnosis not present

## 2012-03-16 DIAGNOSIS — R791 Abnormal coagulation profile: Secondary | ICD-10-CM | POA: Diagnosis not present

## 2012-03-21 DIAGNOSIS — Z7901 Long term (current) use of anticoagulants: Secondary | ICD-10-CM | POA: Diagnosis not present

## 2012-03-28 DIAGNOSIS — M542 Cervicalgia: Secondary | ICD-10-CM | POA: Diagnosis not present

## 2012-03-28 DIAGNOSIS — Z7901 Long term (current) use of anticoagulants: Secondary | ICD-10-CM | POA: Diagnosis not present

## 2012-03-28 DIAGNOSIS — G894 Chronic pain syndrome: Secondary | ICD-10-CM | POA: Diagnosis not present

## 2012-03-28 DIAGNOSIS — G8929 Other chronic pain: Secondary | ICD-10-CM | POA: Diagnosis not present

## 2012-04-04 DIAGNOSIS — Z7901 Long term (current) use of anticoagulants: Secondary | ICD-10-CM | POA: Diagnosis not present

## 2012-04-17 DIAGNOSIS — R791 Abnormal coagulation profile: Secondary | ICD-10-CM | POA: Diagnosis not present

## 2012-04-19 DIAGNOSIS — I10 Essential (primary) hypertension: Secondary | ICD-10-CM | POA: Diagnosis not present

## 2012-04-19 DIAGNOSIS — E782 Mixed hyperlipidemia: Secondary | ICD-10-CM | POA: Diagnosis not present

## 2012-04-19 DIAGNOSIS — I2510 Atherosclerotic heart disease of native coronary artery without angina pectoris: Secondary | ICD-10-CM | POA: Diagnosis not present

## 2012-04-24 DIAGNOSIS — E785 Hyperlipidemia, unspecified: Secondary | ICD-10-CM | POA: Diagnosis not present

## 2012-04-24 DIAGNOSIS — J069 Acute upper respiratory infection, unspecified: Secondary | ICD-10-CM | POA: Diagnosis not present

## 2012-04-24 DIAGNOSIS — I10 Essential (primary) hypertension: Secondary | ICD-10-CM | POA: Diagnosis not present

## 2012-04-24 DIAGNOSIS — I4891 Unspecified atrial fibrillation: Secondary | ICD-10-CM | POA: Diagnosis not present

## 2012-04-24 DIAGNOSIS — R062 Wheezing: Secondary | ICD-10-CM | POA: Diagnosis not present

## 2012-04-24 DIAGNOSIS — M5030 Other cervical disc degeneration, unspecified cervical region: Secondary | ICD-10-CM | POA: Diagnosis not present

## 2012-04-27 ENCOUNTER — Telehealth (HOSPITAL_COMMUNITY): Payer: Self-pay | Admitting: *Deleted

## 2012-05-02 DIAGNOSIS — R791 Abnormal coagulation profile: Secondary | ICD-10-CM | POA: Diagnosis not present

## 2012-05-16 ENCOUNTER — Other Ambulatory Visit: Payer: Self-pay | Admitting: *Deleted

## 2012-05-16 DIAGNOSIS — R791 Abnormal coagulation profile: Secondary | ICD-10-CM | POA: Diagnosis not present

## 2012-05-16 MED ORDER — ATORVASTATIN CALCIUM 40 MG PO TABS: 40.0000 mg | ORAL_TABLET | Freq: Every day | ORAL | Status: DC

## 2012-05-22 ENCOUNTER — Other Ambulatory Visit: Payer: Self-pay | Admitting: *Deleted

## 2012-05-22 MED ORDER — ATORVASTATIN CALCIUM 40 MG PO TABS: 40.0000 mg | ORAL_TABLET | Freq: Every day | ORAL | Status: DC

## 2012-05-22 NOTE — Telephone Encounter (Signed)
rx printed and faxed manually to [facility]

## 2012-05-23 DIAGNOSIS — M129 Arthropathy, unspecified: Secondary | ICD-10-CM | POA: Diagnosis not present

## 2012-05-23 DIAGNOSIS — M542 Cervicalgia: Secondary | ICD-10-CM | POA: Diagnosis not present

## 2012-05-23 DIAGNOSIS — M47812 Spondylosis without myelopathy or radiculopathy, cervical region: Secondary | ICD-10-CM | POA: Diagnosis not present

## 2012-05-30 DIAGNOSIS — Z7901 Long term (current) use of anticoagulants: Secondary | ICD-10-CM | POA: Diagnosis not present

## 2012-06-13 DIAGNOSIS — Z7901 Long term (current) use of anticoagulants: Secondary | ICD-10-CM | POA: Diagnosis not present

## 2012-06-27 DIAGNOSIS — Z7901 Long term (current) use of anticoagulants: Secondary | ICD-10-CM | POA: Diagnosis not present

## 2012-07-02 DIAGNOSIS — H1030 Unspecified acute conjunctivitis, unspecified eye: Secondary | ICD-10-CM | POA: Diagnosis not present

## 2012-07-11 DIAGNOSIS — Z7901 Long term (current) use of anticoagulants: Secondary | ICD-10-CM | POA: Diagnosis not present

## 2012-08-08 DIAGNOSIS — Z7901 Long term (current) use of anticoagulants: Secondary | ICD-10-CM | POA: Diagnosis not present

## 2012-08-24 DIAGNOSIS — Z79899 Other long term (current) drug therapy: Secondary | ICD-10-CM | POA: Diagnosis not present

## 2012-08-24 DIAGNOSIS — I10 Essential (primary) hypertension: Secondary | ICD-10-CM | POA: Diagnosis not present

## 2012-08-24 DIAGNOSIS — E785 Hyperlipidemia, unspecified: Secondary | ICD-10-CM | POA: Diagnosis not present

## 2012-08-24 DIAGNOSIS — I4891 Unspecified atrial fibrillation: Secondary | ICD-10-CM | POA: Diagnosis not present

## 2012-08-31 DIAGNOSIS — E782 Mixed hyperlipidemia: Secondary | ICD-10-CM | POA: Diagnosis not present

## 2012-08-31 DIAGNOSIS — Z7902 Long term (current) use of antithrombotics/antiplatelets: Secondary | ICD-10-CM | POA: Diagnosis not present

## 2012-08-31 DIAGNOSIS — I2510 Atherosclerotic heart disease of native coronary artery without angina pectoris: Secondary | ICD-10-CM | POA: Diagnosis not present

## 2012-08-31 DIAGNOSIS — I495 Sick sinus syndrome: Secondary | ICD-10-CM | POA: Diagnosis not present

## 2012-09-13 DIAGNOSIS — D469 Myelodysplastic syndrome, unspecified: Secondary | ICD-10-CM | POA: Diagnosis not present

## 2012-09-27 ENCOUNTER — Encounter: Payer: Self-pay | Admitting: Cardiovascular Disease

## 2012-10-15 DIAGNOSIS — Z23 Encounter for immunization: Secondary | ICD-10-CM | POA: Diagnosis not present

## 2012-11-26 DIAGNOSIS — R209 Unspecified disturbances of skin sensation: Secondary | ICD-10-CM | POA: Diagnosis not present

## 2012-11-26 DIAGNOSIS — S0993XA Unspecified injury of face, initial encounter: Secondary | ICD-10-CM | POA: Diagnosis not present

## 2012-11-26 DIAGNOSIS — M542 Cervicalgia: Secondary | ICD-10-CM | POA: Diagnosis not present

## 2012-11-26 DIAGNOSIS — Z9181 History of falling: Secondary | ICD-10-CM | POA: Diagnosis not present

## 2012-11-26 DIAGNOSIS — M5030 Other cervical disc degeneration, unspecified cervical region: Secondary | ICD-10-CM | POA: Diagnosis not present

## 2012-11-26 DIAGNOSIS — L2089 Other atopic dermatitis: Secondary | ICD-10-CM | POA: Diagnosis not present

## 2012-12-24 DIAGNOSIS — E785 Hyperlipidemia, unspecified: Secondary | ICD-10-CM | POA: Diagnosis not present

## 2012-12-24 DIAGNOSIS — R209 Unspecified disturbances of skin sensation: Secondary | ICD-10-CM | POA: Diagnosis not present

## 2012-12-24 DIAGNOSIS — I2510 Atherosclerotic heart disease of native coronary artery without angina pectoris: Secondary | ICD-10-CM | POA: Diagnosis not present

## 2012-12-24 DIAGNOSIS — Z79899 Other long term (current) drug therapy: Secondary | ICD-10-CM | POA: Diagnosis not present

## 2012-12-24 DIAGNOSIS — I10 Essential (primary) hypertension: Secondary | ICD-10-CM | POA: Diagnosis not present

## 2012-12-24 DIAGNOSIS — M5030 Other cervical disc degeneration, unspecified cervical region: Secondary | ICD-10-CM | POA: Diagnosis not present

## 2012-12-24 DIAGNOSIS — I4891 Unspecified atrial fibrillation: Secondary | ICD-10-CM | POA: Diagnosis not present

## 2013-01-16 DIAGNOSIS — M79609 Pain in unspecified limb: Secondary | ICD-10-CM | POA: Diagnosis not present

## 2013-01-30 DIAGNOSIS — M25519 Pain in unspecified shoulder: Secondary | ICD-10-CM | POA: Diagnosis not present

## 2013-01-30 DIAGNOSIS — M19019 Primary osteoarthritis, unspecified shoulder: Secondary | ICD-10-CM | POA: Diagnosis not present

## 2013-03-04 DIAGNOSIS — E039 Hypothyroidism, unspecified: Secondary | ICD-10-CM | POA: Diagnosis not present

## 2013-03-11 DIAGNOSIS — Q068 Other specified congenital malformations of spinal cord: Secondary | ICD-10-CM | POA: Diagnosis not present

## 2013-03-11 DIAGNOSIS — I10 Essential (primary) hypertension: Secondary | ICD-10-CM | POA: Diagnosis not present

## 2013-03-11 DIAGNOSIS — E7800 Pure hypercholesterolemia, unspecified: Secondary | ICD-10-CM | POA: Diagnosis not present

## 2013-03-11 DIAGNOSIS — I4891 Unspecified atrial fibrillation: Secondary | ICD-10-CM | POA: Diagnosis not present

## 2013-03-11 DIAGNOSIS — I2510 Atherosclerotic heart disease of native coronary artery without angina pectoris: Secondary | ICD-10-CM | POA: Diagnosis not present

## 2013-03-11 DIAGNOSIS — I252 Old myocardial infarction: Secondary | ICD-10-CM | POA: Diagnosis not present

## 2013-03-11 DIAGNOSIS — I059 Rheumatic mitral valve disease, unspecified: Secondary | ICD-10-CM | POA: Diagnosis not present

## 2013-03-15 DIAGNOSIS — D469 Myelodysplastic syndrome, unspecified: Secondary | ICD-10-CM | POA: Diagnosis not present

## 2013-03-15 DIAGNOSIS — Z09 Encounter for follow-up examination after completed treatment for conditions other than malignant neoplasm: Secondary | ICD-10-CM | POA: Diagnosis not present

## 2013-03-18 DIAGNOSIS — M19019 Primary osteoarthritis, unspecified shoulder: Secondary | ICD-10-CM | POA: Diagnosis not present

## 2013-03-20 DIAGNOSIS — J209 Acute bronchitis, unspecified: Secondary | ICD-10-CM | POA: Diagnosis not present

## 2013-03-28 DIAGNOSIS — J189 Pneumonia, unspecified organism: Secondary | ICD-10-CM | POA: Diagnosis not present

## 2013-03-28 DIAGNOSIS — R059 Cough, unspecified: Secondary | ICD-10-CM | POA: Diagnosis not present

## 2013-03-28 DIAGNOSIS — R05 Cough: Secondary | ICD-10-CM | POA: Diagnosis not present

## 2013-03-29 ENCOUNTER — Other Ambulatory Visit: Payer: Self-pay | Admitting: *Deleted

## 2013-04-25 DIAGNOSIS — I10 Essential (primary) hypertension: Secondary | ICD-10-CM | POA: Diagnosis not present

## 2013-04-25 DIAGNOSIS — Z79899 Other long term (current) drug therapy: Secondary | ICD-10-CM | POA: Diagnosis not present

## 2013-04-25 DIAGNOSIS — E039 Hypothyroidism, unspecified: Secondary | ICD-10-CM | POA: Diagnosis not present

## 2013-04-25 DIAGNOSIS — I2510 Atherosclerotic heart disease of native coronary artery without angina pectoris: Secondary | ICD-10-CM | POA: Diagnosis not present

## 2013-04-25 DIAGNOSIS — E785 Hyperlipidemia, unspecified: Secondary | ICD-10-CM | POA: Diagnosis not present

## 2013-05-22 DIAGNOSIS — H10219 Acute toxic conjunctivitis, unspecified eye: Secondary | ICD-10-CM | POA: Diagnosis not present

## 2013-06-21 DIAGNOSIS — R229 Localized swelling, mass and lump, unspecified: Secondary | ICD-10-CM | POA: Diagnosis not present

## 2013-08-06 DIAGNOSIS — S20219A Contusion of unspecified front wall of thorax, initial encounter: Secondary | ICD-10-CM | POA: Diagnosis not present

## 2013-08-06 DIAGNOSIS — N390 Urinary tract infection, site not specified: Secondary | ICD-10-CM | POA: Diagnosis not present

## 2013-08-06 DIAGNOSIS — M549 Dorsalgia, unspecified: Secondary | ICD-10-CM | POA: Diagnosis not present

## 2013-08-23 DIAGNOSIS — Q068 Other specified congenital malformations of spinal cord: Secondary | ICD-10-CM | POA: Diagnosis not present

## 2013-08-23 DIAGNOSIS — I4891 Unspecified atrial fibrillation: Secondary | ICD-10-CM | POA: Diagnosis not present

## 2013-08-23 DIAGNOSIS — E7800 Pure hypercholesterolemia, unspecified: Secondary | ICD-10-CM | POA: Diagnosis not present

## 2013-08-23 DIAGNOSIS — I10 Essential (primary) hypertension: Secondary | ICD-10-CM | POA: Diagnosis not present

## 2013-08-23 DIAGNOSIS — I2510 Atherosclerotic heart disease of native coronary artery without angina pectoris: Secondary | ICD-10-CM | POA: Diagnosis not present

## 2013-08-27 DIAGNOSIS — Z125 Encounter for screening for malignant neoplasm of prostate: Secondary | ICD-10-CM | POA: Diagnosis not present

## 2013-08-27 DIAGNOSIS — Z79899 Other long term (current) drug therapy: Secondary | ICD-10-CM | POA: Diagnosis not present

## 2013-08-27 DIAGNOSIS — M5030 Other cervical disc degeneration, unspecified cervical region: Secondary | ICD-10-CM | POA: Diagnosis not present

## 2013-08-27 DIAGNOSIS — E039 Hypothyroidism, unspecified: Secondary | ICD-10-CM | POA: Diagnosis not present

## 2013-08-27 DIAGNOSIS — I10 Essential (primary) hypertension: Secondary | ICD-10-CM | POA: Diagnosis not present

## 2013-08-27 DIAGNOSIS — E785 Hyperlipidemia, unspecified: Secondary | ICD-10-CM | POA: Diagnosis not present

## 2013-08-27 DIAGNOSIS — I2510 Atherosclerotic heart disease of native coronary artery without angina pectoris: Secondary | ICD-10-CM | POA: Diagnosis not present

## 2013-08-28 DIAGNOSIS — Z23 Encounter for immunization: Secondary | ICD-10-CM | POA: Diagnosis not present

## 2013-08-29 DIAGNOSIS — Z23 Encounter for immunization: Secondary | ICD-10-CM | POA: Diagnosis not present

## 2013-09-17 DIAGNOSIS — D469 Myelodysplastic syndrome, unspecified: Secondary | ICD-10-CM | POA: Diagnosis not present

## 2013-10-01 DIAGNOSIS — R062 Wheezing: Secondary | ICD-10-CM | POA: Diagnosis not present

## 2013-10-01 DIAGNOSIS — J180 Bronchopneumonia, unspecified organism: Secondary | ICD-10-CM | POA: Diagnosis not present

## 2013-10-11 DIAGNOSIS — H109 Unspecified conjunctivitis: Secondary | ICD-10-CM | POA: Diagnosis not present

## 2013-10-21 DIAGNOSIS — R062 Wheezing: Secondary | ICD-10-CM | POA: Diagnosis not present

## 2013-10-21 DIAGNOSIS — J209 Acute bronchitis, unspecified: Secondary | ICD-10-CM | POA: Diagnosis not present

## 2013-11-25 DIAGNOSIS — R05 Cough: Secondary | ICD-10-CM | POA: Diagnosis not present

## 2013-11-25 DIAGNOSIS — R0981 Nasal congestion: Secondary | ICD-10-CM | POA: Diagnosis not present

## 2013-11-25 DIAGNOSIS — R000 Tachycardia, unspecified: Secondary | ICD-10-CM | POA: Diagnosis not present

## 2013-11-25 DIAGNOSIS — J329 Chronic sinusitis, unspecified: Secondary | ICD-10-CM | POA: Diagnosis not present

## 2013-11-25 DIAGNOSIS — J40 Bronchitis, not specified as acute or chronic: Secondary | ICD-10-CM | POA: Diagnosis not present

## 2013-11-27 DIAGNOSIS — D46A Refractory cytopenia with multilineage dysplasia: Secondary | ICD-10-CM | POA: Diagnosis not present

## 2013-12-06 DIAGNOSIS — J300 Vasomotor rhinitis: Secondary | ICD-10-CM | POA: Diagnosis not present

## 2013-12-06 DIAGNOSIS — R05 Cough: Secondary | ICD-10-CM | POA: Diagnosis not present

## 2013-12-13 DIAGNOSIS — R05 Cough: Secondary | ICD-10-CM | POA: Diagnosis not present

## 2013-12-24 DIAGNOSIS — R05 Cough: Secondary | ICD-10-CM | POA: Diagnosis not present

## 2013-12-24 DIAGNOSIS — J300 Vasomotor rhinitis: Secondary | ICD-10-CM | POA: Diagnosis not present

## 2014-01-08 DIAGNOSIS — D46A Refractory cytopenia with multilineage dysplasia: Secondary | ICD-10-CM | POA: Diagnosis not present

## 2014-02-19 DIAGNOSIS — D46A Refractory cytopenia with multilineage dysplasia: Secondary | ICD-10-CM | POA: Diagnosis not present

## 2014-03-25 DIAGNOSIS — I10 Essential (primary) hypertension: Secondary | ICD-10-CM | POA: Diagnosis not present

## 2014-03-25 DIAGNOSIS — R209 Unspecified disturbances of skin sensation: Secondary | ICD-10-CM | POA: Diagnosis not present

## 2014-03-25 DIAGNOSIS — E039 Hypothyroidism, unspecified: Secondary | ICD-10-CM | POA: Diagnosis not present

## 2014-03-25 DIAGNOSIS — Z79899 Other long term (current) drug therapy: Secondary | ICD-10-CM | POA: Diagnosis not present

## 2014-03-25 DIAGNOSIS — M542 Cervicalgia: Secondary | ICD-10-CM | POA: Diagnosis not present

## 2014-03-25 DIAGNOSIS — E785 Hyperlipidemia, unspecified: Secondary | ICD-10-CM | POA: Diagnosis not present

## 2014-05-20 DIAGNOSIS — D46A Refractory cytopenia with multilineage dysplasia: Secondary | ICD-10-CM | POA: Diagnosis not present

## 2014-07-14 DIAGNOSIS — M4802 Spinal stenosis, cervical region: Secondary | ICD-10-CM | POA: Diagnosis not present

## 2014-07-14 DIAGNOSIS — G5602 Carpal tunnel syndrome, left upper limb: Secondary | ICD-10-CM | POA: Diagnosis not present

## 2014-07-14 DIAGNOSIS — G5622 Lesion of ulnar nerve, left upper limb: Secondary | ICD-10-CM | POA: Diagnosis not present

## 2014-07-14 DIAGNOSIS — M5412 Radiculopathy, cervical region: Secondary | ICD-10-CM | POA: Diagnosis not present

## 2014-07-14 DIAGNOSIS — G5621 Lesion of ulnar nerve, right upper limb: Secondary | ICD-10-CM | POA: Diagnosis not present

## 2014-07-14 DIAGNOSIS — G5601 Carpal tunnel syndrome, right upper limb: Secondary | ICD-10-CM | POA: Diagnosis not present

## 2014-07-16 DIAGNOSIS — R200 Anesthesia of skin: Secondary | ICD-10-CM | POA: Diagnosis not present

## 2014-07-16 DIAGNOSIS — M4802 Spinal stenosis, cervical region: Secondary | ICD-10-CM | POA: Diagnosis not present

## 2014-07-16 DIAGNOSIS — M47892 Other spondylosis, cervical region: Secondary | ICD-10-CM | POA: Diagnosis not present

## 2014-07-16 DIAGNOSIS — M5031 Other cervical disc degeneration,  high cervical region: Secondary | ICD-10-CM | POA: Diagnosis not present

## 2014-07-21 DIAGNOSIS — G5601 Carpal tunnel syndrome, right upper limb: Secondary | ICD-10-CM | POA: Diagnosis not present

## 2014-07-21 DIAGNOSIS — G5621 Lesion of ulnar nerve, right upper limb: Secondary | ICD-10-CM | POA: Diagnosis not present

## 2014-07-21 DIAGNOSIS — G5622 Lesion of ulnar nerve, left upper limb: Secondary | ICD-10-CM | POA: Diagnosis not present

## 2014-07-21 DIAGNOSIS — G5602 Carpal tunnel syndrome, left upper limb: Secondary | ICD-10-CM | POA: Diagnosis not present

## 2014-07-21 DIAGNOSIS — D46A Refractory cytopenia with multilineage dysplasia: Secondary | ICD-10-CM | POA: Diagnosis not present

## 2014-07-21 DIAGNOSIS — M5412 Radiculopathy, cervical region: Secondary | ICD-10-CM | POA: Diagnosis not present

## 2014-07-21 DIAGNOSIS — M4802 Spinal stenosis, cervical region: Secondary | ICD-10-CM | POA: Diagnosis not present

## 2014-07-25 DIAGNOSIS — M5030 Other cervical disc degeneration, unspecified cervical region: Secondary | ICD-10-CM | POA: Diagnosis not present

## 2014-07-25 DIAGNOSIS — I2510 Atherosclerotic heart disease of native coronary artery without angina pectoris: Secondary | ICD-10-CM | POA: Diagnosis not present

## 2014-07-25 DIAGNOSIS — I10 Essential (primary) hypertension: Secondary | ICD-10-CM | POA: Diagnosis not present

## 2014-07-25 DIAGNOSIS — E039 Hypothyroidism, unspecified: Secondary | ICD-10-CM | POA: Diagnosis not present

## 2014-07-25 DIAGNOSIS — E785 Hyperlipidemia, unspecified: Secondary | ICD-10-CM | POA: Diagnosis not present

## 2014-07-28 DIAGNOSIS — Z79899 Other long term (current) drug therapy: Secondary | ICD-10-CM | POA: Diagnosis not present

## 2014-07-28 DIAGNOSIS — E785 Hyperlipidemia, unspecified: Secondary | ICD-10-CM | POA: Diagnosis not present

## 2014-09-22 DIAGNOSIS — D46A Refractory cytopenia with multilineage dysplasia: Secondary | ICD-10-CM | POA: Diagnosis not present

## 2014-10-30 DIAGNOSIS — Z23 Encounter for immunization: Secondary | ICD-10-CM | POA: Diagnosis not present

## 2014-11-21 DIAGNOSIS — D46A Refractory cytopenia with multilineage dysplasia: Secondary | ICD-10-CM | POA: Diagnosis not present

## 2015-01-07 DIAGNOSIS — L2089 Other atopic dermatitis: Secondary | ICD-10-CM | POA: Diagnosis not present

## 2015-01-26 DIAGNOSIS — I10 Essential (primary) hypertension: Secondary | ICD-10-CM | POA: Diagnosis not present

## 2015-01-26 DIAGNOSIS — Z125 Encounter for screening for malignant neoplasm of prostate: Secondary | ICD-10-CM | POA: Diagnosis not present

## 2015-01-26 DIAGNOSIS — Z79899 Other long term (current) drug therapy: Secondary | ICD-10-CM | POA: Diagnosis not present

## 2015-01-26 DIAGNOSIS — I2510 Atherosclerotic heart disease of native coronary artery without angina pectoris: Secondary | ICD-10-CM | POA: Diagnosis not present

## 2015-01-26 DIAGNOSIS — E785 Hyperlipidemia, unspecified: Secondary | ICD-10-CM | POA: Diagnosis not present

## 2015-01-26 DIAGNOSIS — R634 Abnormal weight loss: Secondary | ICD-10-CM | POA: Diagnosis not present

## 2015-01-26 DIAGNOSIS — E039 Hypothyroidism, unspecified: Secondary | ICD-10-CM | POA: Diagnosis not present

## 2015-02-19 DIAGNOSIS — R5383 Other fatigue: Secondary | ICD-10-CM | POA: Diagnosis not present

## 2015-03-20 DIAGNOSIS — D46A Refractory cytopenia with multilineage dysplasia: Secondary | ICD-10-CM | POA: Diagnosis not present

## 2015-04-14 DIAGNOSIS — I2510 Atherosclerotic heart disease of native coronary artery without angina pectoris: Secondary | ICD-10-CM

## 2015-04-14 DIAGNOSIS — I10 Essential (primary) hypertension: Secondary | ICD-10-CM

## 2015-04-14 DIAGNOSIS — E785 Hyperlipidemia, unspecified: Secondary | ICD-10-CM

## 2015-04-14 DIAGNOSIS — I480 Paroxysmal atrial fibrillation: Secondary | ICD-10-CM

## 2015-04-14 HISTORY — DX: Atherosclerotic heart disease of native coronary artery without angina pectoris: I25.10

## 2015-04-14 HISTORY — DX: Paroxysmal atrial fibrillation: I48.0

## 2015-04-14 HISTORY — DX: Essential (primary) hypertension: I10

## 2015-04-14 HISTORY — DX: Hyperlipidemia, unspecified: E78.5

## 2015-04-15 DIAGNOSIS — Z6822 Body mass index (BMI) 22.0-22.9, adult: Secondary | ICD-10-CM | POA: Diagnosis not present

## 2015-04-15 DIAGNOSIS — E785 Hyperlipidemia, unspecified: Secondary | ICD-10-CM | POA: Diagnosis not present

## 2015-04-15 DIAGNOSIS — I10 Essential (primary) hypertension: Secondary | ICD-10-CM | POA: Diagnosis not present

## 2015-04-15 DIAGNOSIS — I480 Paroxysmal atrial fibrillation: Secondary | ICD-10-CM | POA: Diagnosis not present

## 2015-04-15 DIAGNOSIS — I25119 Atherosclerotic heart disease of native coronary artery with unspecified angina pectoris: Secondary | ICD-10-CM | POA: Diagnosis not present

## 2015-05-21 DIAGNOSIS — M7989 Other specified soft tissue disorders: Secondary | ICD-10-CM | POA: Diagnosis not present

## 2015-05-21 DIAGNOSIS — I872 Venous insufficiency (chronic) (peripheral): Secondary | ICD-10-CM | POA: Diagnosis not present

## 2015-06-12 DIAGNOSIS — B370 Candidal stomatitis: Secondary | ICD-10-CM | POA: Diagnosis not present

## 2015-06-12 DIAGNOSIS — R42 Dizziness and giddiness: Secondary | ICD-10-CM | POA: Diagnosis not present

## 2015-06-12 DIAGNOSIS — M109 Gout, unspecified: Secondary | ICD-10-CM | POA: Diagnosis not present

## 2015-06-15 DIAGNOSIS — I10 Essential (primary) hypertension: Secondary | ICD-10-CM | POA: Diagnosis not present

## 2015-06-19 DIAGNOSIS — I10 Essential (primary) hypertension: Secondary | ICD-10-CM | POA: Diagnosis not present

## 2015-06-19 DIAGNOSIS — R5383 Other fatigue: Secondary | ICD-10-CM | POA: Diagnosis not present

## 2015-06-19 DIAGNOSIS — M79644 Pain in right finger(s): Secondary | ICD-10-CM | POA: Diagnosis not present

## 2015-07-01 DIAGNOSIS — I10 Essential (primary) hypertension: Secondary | ICD-10-CM | POA: Diagnosis not present

## 2015-07-01 DIAGNOSIS — M5030 Other cervical disc degeneration, unspecified cervical region: Secondary | ICD-10-CM | POA: Diagnosis not present

## 2015-07-01 DIAGNOSIS — E039 Hypothyroidism, unspecified: Secondary | ICD-10-CM | POA: Diagnosis not present

## 2015-08-11 DIAGNOSIS — Z79899 Other long term (current) drug therapy: Secondary | ICD-10-CM | POA: Diagnosis not present

## 2015-08-31 ENCOUNTER — Other Ambulatory Visit: Payer: Self-pay

## 2015-09-21 DIAGNOSIS — D46A Refractory cytopenia with multilineage dysplasia: Secondary | ICD-10-CM | POA: Diagnosis not present

## 2015-09-24 DIAGNOSIS — J40 Bronchitis, not specified as acute or chronic: Secondary | ICD-10-CM | POA: Diagnosis not present

## 2015-09-24 DIAGNOSIS — R05 Cough: Secondary | ICD-10-CM | POA: Diagnosis not present

## 2015-09-30 DIAGNOSIS — R05 Cough: Secondary | ICD-10-CM | POA: Diagnosis not present

## 2015-09-30 DIAGNOSIS — Z79899 Other long term (current) drug therapy: Secondary | ICD-10-CM | POA: Diagnosis not present

## 2015-09-30 DIAGNOSIS — E785 Hyperlipidemia, unspecified: Secondary | ICD-10-CM | POA: Diagnosis not present

## 2015-10-01 DIAGNOSIS — E785 Hyperlipidemia, unspecified: Secondary | ICD-10-CM | POA: Diagnosis not present

## 2015-10-01 DIAGNOSIS — Z79899 Other long term (current) drug therapy: Secondary | ICD-10-CM | POA: Diagnosis not present

## 2015-10-27 DIAGNOSIS — Z23 Encounter for immunization: Secondary | ICD-10-CM | POA: Diagnosis not present

## 2015-11-03 DIAGNOSIS — I10 Essential (primary) hypertension: Secondary | ICD-10-CM | POA: Diagnosis not present

## 2015-11-03 DIAGNOSIS — M5030 Other cervical disc degeneration, unspecified cervical region: Secondary | ICD-10-CM | POA: Diagnosis not present

## 2015-11-03 DIAGNOSIS — R2689 Other abnormalities of gait and mobility: Secondary | ICD-10-CM | POA: Diagnosis not present

## 2015-11-03 DIAGNOSIS — R5383 Other fatigue: Secondary | ICD-10-CM | POA: Diagnosis not present

## 2015-11-03 DIAGNOSIS — E039 Hypothyroidism, unspecified: Secondary | ICD-10-CM | POA: Diagnosis not present

## 2015-11-03 DIAGNOSIS — Z79899 Other long term (current) drug therapy: Secondary | ICD-10-CM | POA: Diagnosis not present

## 2015-11-03 DIAGNOSIS — I2510 Atherosclerotic heart disease of native coronary artery without angina pectoris: Secondary | ICD-10-CM | POA: Diagnosis not present

## 2015-12-02 DIAGNOSIS — E039 Hypothyroidism, unspecified: Secondary | ICD-10-CM | POA: Diagnosis not present

## 2015-12-02 DIAGNOSIS — I10 Essential (primary) hypertension: Secondary | ICD-10-CM | POA: Diagnosis not present

## 2015-12-02 DIAGNOSIS — E785 Hyperlipidemia, unspecified: Secondary | ICD-10-CM | POA: Diagnosis not present

## 2015-12-02 DIAGNOSIS — L2089 Other atopic dermatitis: Secondary | ICD-10-CM | POA: Diagnosis not present

## 2015-12-02 DIAGNOSIS — M5030 Other cervical disc degeneration, unspecified cervical region: Secondary | ICD-10-CM | POA: Diagnosis not present

## 2015-12-02 DIAGNOSIS — I2510 Atherosclerotic heart disease of native coronary artery without angina pectoris: Secondary | ICD-10-CM | POA: Diagnosis not present

## 2015-12-02 DIAGNOSIS — R2689 Other abnormalities of gait and mobility: Secondary | ICD-10-CM | POA: Diagnosis not present

## 2015-12-02 DIAGNOSIS — M4312 Spondylolisthesis, cervical region: Secondary | ICD-10-CM | POA: Diagnosis not present

## 2015-12-04 DIAGNOSIS — M50321 Other cervical disc degeneration at C4-C5 level: Secondary | ICD-10-CM | POA: Diagnosis not present

## 2015-12-04 DIAGNOSIS — M542 Cervicalgia: Secondary | ICD-10-CM | POA: Diagnosis not present

## 2015-12-14 DIAGNOSIS — E860 Dehydration: Secondary | ICD-10-CM | POA: Diagnosis not present

## 2015-12-17 DIAGNOSIS — Z79899 Other long term (current) drug therapy: Secondary | ICD-10-CM | POA: Diagnosis not present

## 2015-12-17 DIAGNOSIS — R5383 Other fatigue: Secondary | ICD-10-CM | POA: Diagnosis not present

## 2015-12-17 DIAGNOSIS — E039 Hypothyroidism, unspecified: Secondary | ICD-10-CM | POA: Diagnosis not present

## 2016-01-08 DIAGNOSIS — K529 Noninfective gastroenteritis and colitis, unspecified: Secondary | ICD-10-CM | POA: Diagnosis not present

## 2016-01-08 DIAGNOSIS — I252 Old myocardial infarction: Secondary | ICD-10-CM | POA: Diagnosis not present

## 2016-01-08 DIAGNOSIS — I2510 Atherosclerotic heart disease of native coronary artery without angina pectoris: Secondary | ICD-10-CM | POA: Diagnosis not present

## 2016-01-08 DIAGNOSIS — R404 Transient alteration of awareness: Secondary | ICD-10-CM | POA: Diagnosis not present

## 2016-01-08 DIAGNOSIS — Z955 Presence of coronary angioplasty implant and graft: Secondary | ICD-10-CM | POA: Diagnosis not present

## 2016-01-08 DIAGNOSIS — R42 Dizziness and giddiness: Secondary | ICD-10-CM | POA: Diagnosis not present

## 2016-01-08 DIAGNOSIS — R002 Palpitations: Secondary | ICD-10-CM | POA: Diagnosis not present

## 2016-01-08 DIAGNOSIS — I10 Essential (primary) hypertension: Secondary | ICD-10-CM | POA: Diagnosis not present

## 2016-01-08 DIAGNOSIS — I471 Supraventricular tachycardia: Secondary | ICD-10-CM | POA: Diagnosis not present

## 2016-01-08 DIAGNOSIS — E860 Dehydration: Secondary | ICD-10-CM | POA: Diagnosis not present

## 2016-01-08 DIAGNOSIS — E7800 Pure hypercholesterolemia, unspecified: Secondary | ICD-10-CM | POA: Diagnosis not present

## 2016-01-19 DIAGNOSIS — E876 Hypokalemia: Secondary | ICD-10-CM | POA: Diagnosis not present

## 2016-01-19 DIAGNOSIS — Z6821 Body mass index (BMI) 21.0-21.9, adult: Secondary | ICD-10-CM | POA: Diagnosis not present

## 2016-01-19 DIAGNOSIS — I480 Paroxysmal atrial fibrillation: Secondary | ICD-10-CM | POA: Diagnosis not present

## 2016-01-19 DIAGNOSIS — I10 Essential (primary) hypertension: Secondary | ICD-10-CM | POA: Diagnosis not present

## 2016-01-19 DIAGNOSIS — I4892 Unspecified atrial flutter: Secondary | ICD-10-CM | POA: Diagnosis not present

## 2016-02-01 DIAGNOSIS — M5030 Other cervical disc degeneration, unspecified cervical region: Secondary | ICD-10-CM | POA: Diagnosis not present

## 2016-02-01 DIAGNOSIS — I2510 Atherosclerotic heart disease of native coronary artery without angina pectoris: Secondary | ICD-10-CM | POA: Diagnosis not present

## 2016-02-01 DIAGNOSIS — I10 Essential (primary) hypertension: Secondary | ICD-10-CM | POA: Diagnosis not present

## 2016-02-01 DIAGNOSIS — E039 Hypothyroidism, unspecified: Secondary | ICD-10-CM | POA: Diagnosis not present

## 2016-02-01 DIAGNOSIS — L2089 Other atopic dermatitis: Secondary | ICD-10-CM | POA: Diagnosis not present

## 2016-02-01 DIAGNOSIS — L853 Xerosis cutis: Secondary | ICD-10-CM | POA: Diagnosis not present

## 2016-02-09 DIAGNOSIS — E039 Hypothyroidism, unspecified: Secondary | ICD-10-CM | POA: Diagnosis not present

## 2016-03-21 DIAGNOSIS — D46A Refractory cytopenia with multilineage dysplasia: Secondary | ICD-10-CM | POA: Diagnosis not present

## 2016-05-03 DIAGNOSIS — R2689 Other abnormalities of gait and mobility: Secondary | ICD-10-CM | POA: Diagnosis not present

## 2016-05-03 DIAGNOSIS — I2510 Atherosclerotic heart disease of native coronary artery without angina pectoris: Secondary | ICD-10-CM | POA: Diagnosis not present

## 2016-05-03 DIAGNOSIS — I10 Essential (primary) hypertension: Secondary | ICD-10-CM | POA: Diagnosis not present

## 2016-05-03 DIAGNOSIS — R42 Dizziness and giddiness: Secondary | ICD-10-CM | POA: Diagnosis not present

## 2016-05-03 DIAGNOSIS — M4312 Spondylolisthesis, cervical region: Secondary | ICD-10-CM | POA: Diagnosis not present

## 2016-05-03 DIAGNOSIS — H6123 Impacted cerumen, bilateral: Secondary | ICD-10-CM | POA: Diagnosis not present

## 2016-05-10 DIAGNOSIS — H6123 Impacted cerumen, bilateral: Secondary | ICD-10-CM | POA: Diagnosis not present

## 2016-05-18 DIAGNOSIS — I6523 Occlusion and stenosis of bilateral carotid arteries: Secondary | ICD-10-CM | POA: Diagnosis not present

## 2016-05-18 DIAGNOSIS — R42 Dizziness and giddiness: Secondary | ICD-10-CM | POA: Diagnosis not present

## 2016-05-18 DIAGNOSIS — I2510 Atherosclerotic heart disease of native coronary artery without angina pectoris: Secondary | ICD-10-CM | POA: Diagnosis not present

## 2016-05-18 DIAGNOSIS — Z951 Presence of aortocoronary bypass graft: Secondary | ICD-10-CM | POA: Diagnosis not present

## 2016-06-02 DIAGNOSIS — W57XXXA Bitten or stung by nonvenomous insect and other nonvenomous arthropods, initial encounter: Secondary | ICD-10-CM | POA: Diagnosis not present

## 2016-06-02 DIAGNOSIS — L03311 Cellulitis of abdominal wall: Secondary | ICD-10-CM | POA: Diagnosis not present

## 2016-06-02 DIAGNOSIS — S30861A Insect bite (nonvenomous) of abdominal wall, initial encounter: Secondary | ICD-10-CM | POA: Diagnosis not present

## 2016-06-03 DIAGNOSIS — S51012A Laceration without foreign body of left elbow, initial encounter: Secondary | ICD-10-CM | POA: Diagnosis not present

## 2016-06-03 DIAGNOSIS — W19XXXA Unspecified fall, initial encounter: Secondary | ICD-10-CM | POA: Diagnosis not present

## 2016-06-03 DIAGNOSIS — S51011A Laceration without foreign body of right elbow, initial encounter: Secondary | ICD-10-CM | POA: Diagnosis not present

## 2016-08-08 DIAGNOSIS — E785 Hyperlipidemia, unspecified: Secondary | ICD-10-CM | POA: Diagnosis not present

## 2016-08-08 DIAGNOSIS — I10 Essential (primary) hypertension: Secondary | ICD-10-CM | POA: Diagnosis not present

## 2016-08-08 DIAGNOSIS — Z79899 Other long term (current) drug therapy: Secondary | ICD-10-CM | POA: Diagnosis not present

## 2016-08-08 DIAGNOSIS — M5030 Other cervical disc degeneration, unspecified cervical region: Secondary | ICD-10-CM | POA: Diagnosis not present

## 2016-08-08 DIAGNOSIS — E039 Hypothyroidism, unspecified: Secondary | ICD-10-CM | POA: Diagnosis not present

## 2016-08-08 DIAGNOSIS — I2510 Atherosclerotic heart disease of native coronary artery without angina pectoris: Secondary | ICD-10-CM | POA: Diagnosis not present

## 2016-08-08 DIAGNOSIS — Z125 Encounter for screening for malignant neoplasm of prostate: Secondary | ICD-10-CM | POA: Diagnosis not present

## 2016-08-08 DIAGNOSIS — R2689 Other abnormalities of gait and mobility: Secondary | ICD-10-CM | POA: Diagnosis not present

## 2016-09-19 DIAGNOSIS — D46A Refractory cytopenia with multilineage dysplasia: Secondary | ICD-10-CM | POA: Diagnosis not present

## 2016-10-18 DIAGNOSIS — Z23 Encounter for immunization: Secondary | ICD-10-CM | POA: Diagnosis not present

## 2016-12-08 DIAGNOSIS — M4312 Spondylolisthesis, cervical region: Secondary | ICD-10-CM | POA: Diagnosis not present

## 2016-12-08 DIAGNOSIS — E039 Hypothyroidism, unspecified: Secondary | ICD-10-CM | POA: Diagnosis not present

## 2016-12-08 DIAGNOSIS — I10 Essential (primary) hypertension: Secondary | ICD-10-CM | POA: Diagnosis not present

## 2016-12-08 DIAGNOSIS — I2510 Atherosclerotic heart disease of native coronary artery without angina pectoris: Secondary | ICD-10-CM | POA: Diagnosis not present

## 2016-12-08 DIAGNOSIS — Z79899 Other long term (current) drug therapy: Secondary | ICD-10-CM | POA: Diagnosis not present

## 2016-12-08 DIAGNOSIS — E785 Hyperlipidemia, unspecified: Secondary | ICD-10-CM | POA: Diagnosis not present

## 2016-12-08 DIAGNOSIS — M542 Cervicalgia: Secondary | ICD-10-CM | POA: Diagnosis not present

## 2017-03-08 DIAGNOSIS — M5030 Other cervical disc degeneration, unspecified cervical region: Secondary | ICD-10-CM | POA: Diagnosis not present

## 2017-03-08 DIAGNOSIS — E039 Hypothyroidism, unspecified: Secondary | ICD-10-CM | POA: Diagnosis not present

## 2017-03-08 DIAGNOSIS — R42 Dizziness and giddiness: Secondary | ICD-10-CM | POA: Diagnosis not present

## 2017-03-08 DIAGNOSIS — R51 Headache: Secondary | ICD-10-CM | POA: Diagnosis not present

## 2017-03-08 DIAGNOSIS — G5603 Carpal tunnel syndrome, bilateral upper limbs: Secondary | ICD-10-CM | POA: Diagnosis not present

## 2017-03-08 DIAGNOSIS — Z79899 Other long term (current) drug therapy: Secondary | ICD-10-CM | POA: Diagnosis not present

## 2017-03-13 DIAGNOSIS — R296 Repeated falls: Secondary | ICD-10-CM | POA: Diagnosis not present

## 2017-03-13 DIAGNOSIS — R51 Headache: Secondary | ICD-10-CM | POA: Diagnosis not present

## 2017-03-13 DIAGNOSIS — R42 Dizziness and giddiness: Secondary | ICD-10-CM | POA: Diagnosis not present

## 2017-03-20 DIAGNOSIS — D46A Refractory cytopenia with multilineage dysplasia: Secondary | ICD-10-CM | POA: Diagnosis not present

## 2017-04-18 DIAGNOSIS — J9801 Acute bronchospasm: Secondary | ICD-10-CM | POA: Diagnosis not present

## 2017-04-18 DIAGNOSIS — J40 Bronchitis, not specified as acute or chronic: Secondary | ICD-10-CM | POA: Diagnosis not present

## 2017-07-10 DIAGNOSIS — Z79899 Other long term (current) drug therapy: Secondary | ICD-10-CM | POA: Diagnosis not present

## 2017-07-10 DIAGNOSIS — R05 Cough: Secondary | ICD-10-CM | POA: Diagnosis not present

## 2017-07-10 DIAGNOSIS — E785 Hyperlipidemia, unspecified: Secondary | ICD-10-CM | POA: Diagnosis not present

## 2017-07-10 DIAGNOSIS — E039 Hypothyroidism, unspecified: Secondary | ICD-10-CM | POA: Diagnosis not present

## 2017-07-10 DIAGNOSIS — I2510 Atherosclerotic heart disease of native coronary artery without angina pectoris: Secondary | ICD-10-CM | POA: Diagnosis not present

## 2017-07-10 DIAGNOSIS — R490 Dysphonia: Secondary | ICD-10-CM | POA: Diagnosis not present

## 2017-07-10 DIAGNOSIS — I10 Essential (primary) hypertension: Secondary | ICD-10-CM | POA: Diagnosis not present

## 2017-07-13 DIAGNOSIS — Z79899 Other long term (current) drug therapy: Secondary | ICD-10-CM | POA: Diagnosis not present

## 2017-07-13 DIAGNOSIS — E039 Hypothyroidism, unspecified: Secondary | ICD-10-CM | POA: Diagnosis not present

## 2017-07-13 DIAGNOSIS — E785 Hyperlipidemia, unspecified: Secondary | ICD-10-CM | POA: Diagnosis not present

## 2017-08-04 ENCOUNTER — Other Ambulatory Visit: Payer: Self-pay

## 2017-09-02 DIAGNOSIS — S0990XA Unspecified injury of head, initial encounter: Secondary | ICD-10-CM | POA: Diagnosis not present

## 2017-09-02 DIAGNOSIS — S5012XA Contusion of left forearm, initial encounter: Secondary | ICD-10-CM | POA: Diagnosis not present

## 2017-09-02 DIAGNOSIS — S40012A Contusion of left shoulder, initial encounter: Secondary | ICD-10-CM | POA: Diagnosis not present

## 2017-09-02 DIAGNOSIS — S199XXA Unspecified injury of neck, initial encounter: Secondary | ICD-10-CM | POA: Diagnosis not present

## 2017-09-02 DIAGNOSIS — S60212A Contusion of left wrist, initial encounter: Secondary | ICD-10-CM | POA: Diagnosis not present

## 2017-09-02 DIAGNOSIS — S299XXA Unspecified injury of thorax, initial encounter: Secondary | ICD-10-CM | POA: Diagnosis not present

## 2017-09-08 ENCOUNTER — Telehealth: Payer: Self-pay | Admitting: Cardiology

## 2017-09-08 NOTE — Telephone Encounter (Signed)
Please call regarding plavix continuation

## 2017-09-08 NOTE — Telephone Encounter (Signed)
Patients daughter calling with concerns about patient falling over the weekend. Patients daughter states that he fell over the weekend and was taken to [facility] ED.  Head CT was performed and was normal. Patients daughter is concerned about her fathers medication as he is taking Plavix and aspirin.  Dr Bettina Gavia advised of daughters concerns, and she was advised per Dr Joya Gaskins verbal order to discontinue plavix and continue with aspirin.    Patients daughter agrees to plan and verbalized understanding.

## 2017-09-23 DIAGNOSIS — Z23 Encounter for immunization: Secondary | ICD-10-CM | POA: Diagnosis not present

## 2017-09-28 DIAGNOSIS — D46A Refractory cytopenia with multilineage dysplasia: Secondary | ICD-10-CM | POA: Diagnosis not present

## 2017-10-20 ENCOUNTER — Encounter: Payer: Self-pay | Admitting: Cardiology

## 2017-10-20 DIAGNOSIS — G894 Chronic pain syndrome: Secondary | ICD-10-CM

## 2017-10-20 DIAGNOSIS — Z79891 Long term (current) use of opiate analgesic: Secondary | ICD-10-CM

## 2017-10-20 DIAGNOSIS — M5030 Other cervical disc degeneration, unspecified cervical region: Secondary | ICD-10-CM | POA: Insufficient documentation

## 2017-10-20 DIAGNOSIS — M47812 Spondylosis without myelopathy or radiculopathy, cervical region: Secondary | ICD-10-CM

## 2017-10-20 HISTORY — DX: Chronic pain syndrome: G89.4

## 2017-10-20 HISTORY — DX: Long term (current) use of opiate analgesic: Z79.891

## 2017-10-20 HISTORY — DX: Spondylosis without myelopathy or radiculopathy, cervical region: M47.812

## 2017-10-20 HISTORY — DX: Other cervical disc degeneration, unspecified cervical region: M50.30

## 2017-10-26 NOTE — Progress Notes (Signed)
Cardiology Office Note:    Date:  10/30/2017   ID:  Lance [name], DOB 1925/09/19, MRN [id number]  PCP:  Ernestene Kiel, MD  Cardiologist:  Shirlee More, MD    Referring MD: Ernestene Kiel, MD    ASSESSMENT:    1. PAF (paroxysmal atrial fibrillation) ([place])   2. Essential hypertension   3. Myelodysplasia (myelodysplastic syndrome) ([place])    PLAN:    In order of problems listed above:  1. Stable in sinus rhythm because of fall potential trauma he takes aspirin cardiovascular stroke prophylaxis and low-dose beta-blocker for paroxysmal atrial fibrillation he will continue the same if he has frequent episodes would benefit from antiarrhythmic drug.  After discussion of options risk and benefits with the patient and daughter he will not be anticoagulated 2. Stable continue current treatment beta-blocker 3. Stable managed by oncology he is in remission   Next appointment: 6 months   Medication Adjustments/Labs and Tests Ordered: Current medicines are reviewed at length with the patient today.  Concerns regarding medicines are outlined above.  Orders Placed This Encounter  Procedures  . EKG 12-Lead   No orders of the defined types were placed in this encounter.   Chief Complaint  Patient presents with  . Annual Exam    after [facility] ED visit, fall and clopidogrel stopped.    History of Present Illness:    Lance [name] is a 84 y.o. male with a hx of coronary artery disease, paroxysmal atrial fibrillation and flutter, hypertension, dyslipidemia and myelodysplasia.Marland Kitchen  He was last seen 01/19/2016.  His family contacted my office last month after a fall with head trauma and evaluation at [facility] and because of this we chose  to discontinue clopidogrel.  I reviewed there is some [facility] records his CBC was normal he was in sinus rhythm.  CT scan of the head showed no evidence of intracranial hemorrhage.  He remains unsteady he has had no further falls  no chest pain palpitations syncope or TIA.  At this time he takes only aspirin as antithrombotic therapy Compliance with diet, lifestyle and medications: Yes Past Medical History:  Diagnosis Date  . Atrial fibrillation ([place])   . Chronic pain syndrome 10/20/2017  . Coronary artery disease   . Coronary artery disease involving native heart 04/14/2015  . Degenerative disc disease, cervical 10/20/2017  . Essential hypertension 04/14/2015  . Facet arthropathy, cervical 10/20/2017  . Hyperlipidemia 04/14/2015  . Long term current use of opiate analgesic 10/20/2017  . Myelodysplasia   . PAF (paroxysmal atrial fibrillation) ([place]) 04/14/2015  . Pancytopenia ([place])   . Refractory cytopenia with multilineage dysplasia [facility])     Past Surgical History:  Procedure Laterality Date  . CARDIAC CATHETERIZATION  03/2007  . CARDIAC CATHETERIZATION  01/1997  . CATARACT EXTRACTION    . CHOLECYSTECTOMY    . HIP SURGERY    . KNEE SURGERY      Current Medications: Current Meds  Medication Sig  . ALPRAZolam (XANAX) 0.25 MG tablet Take 1 tablet by mouth as needed.  Marland Kitchen aspirin EC 81 MG tablet Take 1 tablet by mouth daily.  Marland Kitchen atorvastatin (LIPITOR) 10 MG tablet TAKE 1 TABLET BY MOUTH ONCE DAILY FOR CHOLESTEROL.  Marland Kitchen gabapentin (NEURONTIN) 100 MG capsule Take 1 capsule by mouth as needed.  Marland Kitchen levothyroxine (SYNTHROID, LEVOTHROID) 50 MCG tablet Take 1 tablet by mouth daily.  . metoprolol succinate (TOPROL-XL) 50 MG 24 hr tablet Take 1 tablet by mouth daily.  . nitroGLYCERIN (NITROSTAT) 0.4  MG SL tablet TNT AS DIRECTED  . PARoxetine (PAXIL) 20 MG tablet Take 20 mg by mouth every morning.  . ranitidine (ZANTAC) 300 MG tablet Take 1 tablet by mouth daily.  . traMADol (ULTRAM) 50 MG tablet Take 1 tablet by mouth as needed.  . [DISCONTINUED] clopidogrel (PLAVIX) 75 MG tablet Take 1 tablet by mouth daily.  . [DISCONTINUED] PARoxetine (PAXIL) 10 MG tablet Take 1 tablet by mouth daily.  . [DISCONTINUED] zaleplon  (SONATA) 5 MG capsule Take 1 capsule by mouth daily.     Allergies:   Morphine and related and Penicillins   Social History   Socioeconomic History  . Marital status: Married    Spouse name: Not on file  . Number of children: Not on file  . Years of education: Not on file  . Highest education level: Not on file  Occupational History  . Not on file  Social Needs  . Financial resource strain: Not on file  . Food insecurity:    Worry: Not on file    Inability: Not on file  . Transportation needs:    Medical: Not on file    Non-medical: Not on file  Tobacco Use  . Smoking status: Never Smoker  . Smokeless tobacco: Never Used  Substance and Sexual Activity  . Alcohol use: Not Currently    Alcohol/week: 0.0 standard drinks  . Drug use: Never  . Sexual activity: Not on file  Lifestyle  . Physical activity:    Days per week: Not on file    Minutes per session: Not on file  . Stress: Not on file  Relationships  . Social connections:    Talks on phone: Not on file    Gets together: Not on file    Attends religious service: Not on file    Active member of club or organization: Not on file    Attends meetings of clubs or organizations: Not on file    Relationship status: Not on file  Other Topics Concern  . Not on file  Social History Narrative  . Not on file     Family History: The patient's family history includes Coronary artery disease in his brother, father, and mother; Dementia in his mother; Diabetes in his father; Hypertension in his father and mother. ROS:   Please see the history of present illness.    All other systems reviewed and are negative.  EKGs/Labs/Other Studies Reviewed:    The following studies were reviewed today:  EKG:  EKG at [facility] sinus rhythm independently reviewed  Recent Labs: Recent CBC BMP normal at [facility]  Physical Exam:    VS:  BP 110/60 (BP Location: Right Arm, Patient Position: Sitting, Cuff Size: Normal)    Pulse 68   Ht 5\' 10"  (1.778 m)   Wt 160 lb (72.6 kg)   SpO2 98%   BMI 22.96 kg/m     Wt Readings from Last 3 Encounters:  10/27/17 160 lb (72.6 kg)     GEN:  Well nourished, well developed in no acute distress HEENT: Normal NECK: No JVD; No carotid bruits LYMPHATICS: No lymphadenopathy CARDIAC: RRR, no murmurs, rubs, gallops RESPIRATORY:  Clear to auscultation without rales, wheezing or rhonchi  ABDOMEN: Soft, non-tender, non-distended MUSCULOSKELETAL:  No edema; No deformity  SKIN: Warm and dry NEUROLOGIC:  Alert and oriented x 3 PSYCHIATRIC:  Normal affect    Signed, Shirlee More, MD  10/30/2017 7:44 AM    [facility]

## 2017-10-27 ENCOUNTER — Ambulatory Visit (INDEPENDENT_AMBULATORY_CARE_PROVIDER_SITE_OTHER): Payer: Medicare Other | Admitting: Cardiology

## 2017-10-27 ENCOUNTER — Encounter: Payer: Self-pay | Admitting: Cardiology

## 2017-10-27 VITALS — BP 110/60 | HR 68 | Ht 70.0 in | Wt 160.0 lb

## 2017-10-27 DIAGNOSIS — I10 Essential (primary) hypertension: Secondary | ICD-10-CM

## 2017-10-27 DIAGNOSIS — I4892 Unspecified atrial flutter: Secondary | ICD-10-CM | POA: Insufficient documentation

## 2017-10-27 DIAGNOSIS — I480 Paroxysmal atrial fibrillation: Secondary | ICD-10-CM

## 2017-10-27 DIAGNOSIS — D469 Myelodysplastic syndrome, unspecified: Secondary | ICD-10-CM | POA: Diagnosis not present

## 2017-10-27 NOTE — Patient Instructions (Addendum)
Medication Instructions:  Your physician recommends that you continue on your current medications as directed. Please refer to the Current Medication list given to you today.  If you need a refill on your cardiac medications before your next appointment, please call your pharmacy.   Lab work: None  If you have labs (blood work) drawn today and your tests are completely normal, you will receive your results only by: . MyChart Message (if you have MyChart) OR . A paper copy in the mail If you have any lab test that is abnormal or we need to change your treatment, we will call you to review the results.  Testing/Procedures: You had an EKG today.   Follow-Up: At [facility], you and your health needs are our priority.  As part of our continuing mission to provide you with exceptional heart care, we have created designated Provider Care Teams.  These Care Teams include your primary Cardiologist (physician) and Advanced Practice Providers (APPs -  Physician Assistants and Nurse Practitioners) who all work together to provide you with the care you need, when you need it. You will need a follow up appointment in 6 months.  Please call our office 2 months in advance to schedule this appointment.        KardiaMobile [email or web address]        FDA-cleared, clinical grade mobile EKG monitor: Kardia is the most clinically-validated mobile EKG used by the world's leading cardiac care medical professionals With Basic service, know instantly if your heart rhythm is normal or if atrial fibrillation is detected, and email the last single EKG recording to yourself or your doctor Premium service, available for purchase through the Kardia app for $9.99 per month or $99 per year, includes unlimited history and storage of your EKG recordings, a monthly EKG summary report to share with your doctor, along with the ability to track your blood pressure, activity and weight  Includes one KardiaMobile phone clip FREE SHIPPING: Standard delivery 1-3 business days. Orders placed by 11:00am PST will ship that afternoon. Otherwise, will ship next business day. All orders ship via USPS Priority Mail from [place], [place]     

## 2017-11-07 DIAGNOSIS — E785 Hyperlipidemia, unspecified: Secondary | ICD-10-CM | POA: Diagnosis not present

## 2017-11-07 DIAGNOSIS — Z1331 Encounter for screening for depression: Secondary | ICD-10-CM | POA: Diagnosis not present

## 2017-11-07 DIAGNOSIS — Z7189 Other specified counseling: Secondary | ICD-10-CM | POA: Diagnosis not present

## 2017-11-07 DIAGNOSIS — Z79899 Other long term (current) drug therapy: Secondary | ICD-10-CM | POA: Diagnosis not present

## 2017-11-07 DIAGNOSIS — Z6824 Body mass index (BMI) 24.0-24.9, adult: Secondary | ICD-10-CM | POA: Diagnosis not present

## 2017-11-07 DIAGNOSIS — M542 Cervicalgia: Secondary | ICD-10-CM | POA: Diagnosis not present

## 2017-11-07 DIAGNOSIS — E039 Hypothyroidism, unspecified: Secondary | ICD-10-CM | POA: Diagnosis not present

## 2017-11-07 DIAGNOSIS — Z0000 Encounter for general adult medical examination without abnormal findings: Secondary | ICD-10-CM | POA: Diagnosis not present

## 2017-11-07 DIAGNOSIS — Z1339 Encounter for screening examination for other mental health and behavioral disorders: Secondary | ICD-10-CM | POA: Diagnosis not present

## 2017-11-07 DIAGNOSIS — I10 Essential (primary) hypertension: Secondary | ICD-10-CM | POA: Diagnosis not present

## 2018-01-29 DIAGNOSIS — G5603 Carpal tunnel syndrome, bilateral upper limbs: Secondary | ICD-10-CM | POA: Diagnosis not present

## 2018-02-19 DIAGNOSIS — R600 Localized edema: Secondary | ICD-10-CM | POA: Diagnosis not present

## 2018-02-19 DIAGNOSIS — E663 Overweight: Secondary | ICD-10-CM | POA: Diagnosis not present

## 2018-02-19 DIAGNOSIS — M7989 Other specified soft tissue disorders: Secondary | ICD-10-CM | POA: Diagnosis not present

## 2018-02-19 DIAGNOSIS — Z6825 Body mass index (BMI) 25.0-25.9, adult: Secondary | ICD-10-CM | POA: Diagnosis not present

## 2018-02-19 DIAGNOSIS — Z01818 Encounter for other preprocedural examination: Secondary | ICD-10-CM | POA: Diagnosis not present

## 2018-05-02 ENCOUNTER — Telehealth: Payer: Self-pay | Admitting: Cardiology

## 2018-05-02 NOTE — Telephone Encounter (Signed)
Virtual Visit Pre-Appointment Phone Call  "(Name), I am calling you today to discuss your upcoming appointment. We are currently trying to limit exposure to the virus that causes COVID-19 by seeing patients at home rather than in the office."  1. "What is the BEST phone number to call the day of the visit?" - include this in appointment notes  2. Do you have or have access to (through a family member/friend) a smartphone with video capability that we can use for your visit?" a. If yes - list this number in appt notes as cell (if different from BEST phone #) and list the appointment type as a VIDEO visit in appointment notes b. If no - list the appointment type as a PHONE visit in appointment notes  3. Confirm consent - "In the setting of the current Covid19 crisis, you are scheduled for a (phone or video) visit with your provider on (date) at (time).  Just as we do with many in-office visits, in order for you to participate in this visit, we must obtain consent.  If you'd like, I can send this to your mychart (if signed up) or email for you to review.  Otherwise, I can obtain your verbal consent now.  All virtual visits are billed to your insurance company just like a normal visit would be.  By agreeing to a virtual visit, we'd like you to understand that the technology does not allow for your provider to perform an examination, and thus may limit your provider's ability to fully assess your condition. If your provider identifies any concerns that need to be evaluated in person, we will make arrangements to do so.  Finally, though the technology is pretty good, we cannot assure that it will always work on either your or our end, and in the setting of a video visit, we may have to convert it to a phone-only visit.  In either situation, we cannot ensure that we have a secure connection.  Are you willing to proceed?" STAFF: Did the patient verbally acknowledge consent to telehealth visit? Document  YES/NO here: Yes  4. Advise patient to be prepared - "Two hours prior to your appointment, go ahead and check your blood pressure, pulse, oxygen saturation, and your weight (if you have the equipment to check those) and write them all down. When your visit starts, your provider will ask you for this information. If you have an Apple Watch or Kardia device, please plan to have heart rate information ready on the day of your appointment. Please have a pen and paper handy nearby the day of the visit as well."  5. Give patient instructions for MyChart download to smartphone OR Doximity/Doxy.me as below if video visit (depending on what platform provider is using)  6. Inform patient they will receive a phone call 15 minutes prior to their appointment time (may be from unknown caller ID) so they should be prepared to answer    TELEPHONE CALL NOTE  Lance [name] has been deemed a candidate for a follow-up tele-health visit to limit community exposure during the Covid-19 pandemic. I spoke with the patient via phone to ensure availability of phone/video source, confirm preferred email & phone number, and discuss instructions and expectations.  I reminded Lance [name] to be prepared with any vital sign and/or heart rhythm information that could potentially be obtained via home monitoring, at the time of his visit. I reminded Lance [name] to expect a phone call prior to  his visit.  Lance [name] 05/02/2018 3:52 PM   INSTRUCTIONS FOR DOWNLOADING THE MYCHART APP TO SMARTPHONE  - The patient must first make sure to have activated MyChart and know their login information - If Apple, go to [employer] and type in MyChart in the search bar and download the app. If Android, ask patient to go to [employer] and type in [place] in the search bar and download the app. The app is free but as with any other app downloads, their phone may require them to verify saved payment information or  Apple/Android password.  - The patient will need to then log into the app with their MyChart username and password, and select [facility] as their healthcare provider to link the account. When it is time for your visit, go to the MyChart app, find appointments, and click Begin Video Visit. Be sure to Select Allow for your device to access the Microphone and Camera for your visit. You will then be connected, and your provider will be with you shortly.  **If they have any issues connecting, or need assistance please contact MyChart service desk [phone])**  **If using a computer, in order to ensure the best quality for their visit they will need to use either of the following Internet Browsers: [employer], or Google Chrome**  IF USING DOXIMITY or DOXY.ME - The patient will receive a link just prior to their visit by text.     FULL LENGTH CONSENT FOR TELE-HEALTH VISIT   I hereby voluntarily request, consent and authorize [facility] and its employed or contracted physicians, physician assistants, nurse practitioners or other licensed health care professionals (the Practitioner), to provide me with telemedicine health care services (the Services") as deemed necessary by the treating Practitioner. I acknowledge and consent to receive the Services by the Practitioner via telemedicine. I understand that the telemedicine visit will involve communicating with the Practitioner through live audiovisual communication technology and the disclosure of certain medical information by electronic transmission. I acknowledge that I have been given the opportunity to request an in-person assessment or other available alternative prior to the telemedicine visit and am voluntarily participating in the telemedicine visit.  I understand that I have the right to withhold or withdraw my consent to the use of telemedicine in the course of my care at any time, without affecting my right to future care  or treatment, and that the Practitioner or I may terminate the telemedicine visit at any time. I understand that I have the right to inspect all information obtained and/or recorded in the course of the telemedicine visit and may receive copies of available information for a reasonable fee.  I understand that some of the potential risks of receiving the Services via telemedicine include:   Delay or interruption in medical evaluation due to technological equipment failure or disruption;  Information transmitted may not be sufficient (e.g. poor resolution of images) to allow for appropriate medical decision making by the Practitioner; and/or   In rare instances, security protocols could fail, causing a breach of personal health information.  Furthermore, I acknowledge that it is my responsibility to provide information about my medical history, conditions and care that is complete and accurate to the best of my ability. I acknowledge that Practitioner's advice, recommendations, and/or decision may be based on factors not within their control, such as incomplete or inaccurate data provided by me or distortions of diagnostic images or specimens that may result from electronic transmissions. I  understand that the practice of medicine is not an exact science and that Practitioner makes no warranties or guarantees regarding treatment outcomes. I acknowledge that I will receive a copy of this consent concurrently upon execution via email to the email address I last provided but may also request a printed copy by calling the office of [place].    I understand that my insurance will be billed for this visit.   I have read or had this consent read to me.  I understand the contents of this consent, which adequately explains the benefits and risks of the Services being provided via telemedicine.   I have been provided ample opportunity to ask questions regarding this consent and the Services and have had  my questions answered to my satisfaction.  I give my informed consent for the services to be provided through the use of telemedicine in my medical care  By participating in this telemedicine visit I agree to the above.

## 2018-05-03 DIAGNOSIS — Z9181 History of falling: Secondary | ICD-10-CM | POA: Insufficient documentation

## 2018-05-04 ENCOUNTER — Other Ambulatory Visit: Payer: Self-pay

## 2018-05-04 ENCOUNTER — Encounter: Payer: Self-pay | Admitting: Cardiology

## 2018-05-04 ENCOUNTER — Telehealth (INDEPENDENT_AMBULATORY_CARE_PROVIDER_SITE_OTHER): Payer: Medicare Other | Admitting: Cardiology

## 2018-05-04 VITALS — Ht 70.0 in | Wt 167.0 lb

## 2018-05-04 DIAGNOSIS — I480 Paroxysmal atrial fibrillation: Secondary | ICD-10-CM | POA: Diagnosis not present

## 2018-05-04 DIAGNOSIS — Z9181 History of falling: Secondary | ICD-10-CM

## 2018-05-04 DIAGNOSIS — I10 Essential (primary) hypertension: Secondary | ICD-10-CM

## 2018-05-04 DIAGNOSIS — E782 Mixed hyperlipidemia: Secondary | ICD-10-CM

## 2018-05-04 NOTE — Progress Notes (Signed)
Virtual Visit via Telephone Note   This visit type was conducted due to national recommendations for restrictions regarding the COVID-19 Pandemic (e.g. social distancing) in an effort to limit this patient's exposure and mitigate transmission in our community.  Due to his co-morbid illnesses, this patient is at least at moderate risk for complications without adequate follow up.  This format is felt to be most appropriate for this patient at this time.  The patient did not have access to video technology/had technical difficulties with video requiring transitioning to audio format only (telephone).  All issues noted in this document were discussed and addressed.  No physical exam could be performed with this format.  Please refer to the patient's chart for his  consent to telehealth for [facility].   Date:  05/04/2018   ID:  Lance [name], DOB 1925-10-30, MRN [id number]  Patient Location: Home Provider Location: Home  PCP:  Ernestene Kiel, MD  Cardiologist:  No primary care provider on file. Dr Bettina Gavia Electrophysiologist:  None   Evaluation Performed:  Follow-Up Visit  Chief Complaint: Follow-up for CAD and atrial fibrillation he is no longer anticoagulated because of falls and trauma  History of Present Illness:     Lance [name] is a 85 y.o. male with a hx of coronary artery disease, paroxysmal atrial fibrillation and flutter, hypertension, dyslipidemia and myelodysplasia.Marland Kitchen  He was last seen  10/27/17.  At that time he is maintaining sinus rhythm and not anticoagulated because of high fall risk.  This was a shared decision with the patient and his daughter weighing the risk benefits and option of anticoagulation versus low-dose aspirin.  Prior to this he had a fall and clopidogrel was discontinued.  The patient does not have symptoms concerning for COVID-19 infection (fever, chills, cough, or new shortness of breath).   This was a difficult visit he has no access to  video phone or Internet at the house.  The quality of his home phone was poor and on my prompting he left the conversation and turned off the heater which was causing background noise.  Even with this it was a very long conversation repeatedly restating back-and-forth so we could understand each other.  If we do another virtual visit I will see if a family member can come with a smart phone  He feels that he is done well he has had no further falls trauma hospitalization and follows up with oncology for his myelodysplasia.  I will access his recent labs and put an addendum to this note.  Unfortunately does not check heart rate and blood pressure at home and does not have a device.  He has had no chest pain edema shortness of breath palpitation or syncope and has had no TIA.  Note he is no longer anticoagulated. Past Medical History:  Diagnosis Date  . Atrial fibrillation ([place])   . Chronic pain syndrome 10/20/2017  . Coronary artery disease   . Coronary artery disease involving native heart 04/14/2015  . Degenerative disc disease, cervical 10/20/2017  . Essential hypertension 04/14/2015  . Facet arthropathy, cervical 10/20/2017  . Hyperlipidemia 04/14/2015  . Long term current use of opiate analgesic 10/20/2017  . Myelodysplasia   . PAF (paroxysmal atrial fibrillation) ([place]) 04/14/2015  . Pancytopenia ([place])   . 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 CARDIAC CATHETERIZATION  03/2007  . CARDIAC CATHETERIZATION  01/1997  . CATARACT EXTRACTION    .  CHOLECYSTECTOMY    . HIP SURGERY    . KNEE SURGERY       No outpatient medications have been marked as taking for the 05/04/18 encounter (Appointment) with Richardo Priest, MD.     Allergies:   Morphine and related and Penicillins   Social History   Tobacco Use  . Smoking status: Never Smoker  . Smokeless tobacco: Never Used  Substance Use Topics  . Alcohol use: Not  Currently    Alcohol/week: 0.0 standard drinks  . Drug use: Never     Family Hx: The patient's family history includes Coronary artery disease in his brother, father, and mother; Dementia in his mother; Diabetes in his father; Hypertension in his father and mother.  ROS:   Please see the history of present illness.     All other systems reviewed and are negative.   Prior CV studies:   The following studies were reviewed today:    Labs/Other Tests and Data Reviewed:    EKG:  An ECG dated 10/27/17 was personally reviewed today and demonstrated:  sinus rhythm 65 bpm and is normal  Recent Labs: 11/07/2017 Creatinine 0.94 liver function normal Cholesterol 152 HDL 53 LDL 79  Wt Readings from Last 3 Encounters:  10/27/17 160 lb (72.6 kg)     Objective:    Vital Signs:  There were no vitals taken for this visit.   VITAL SIGNS:  reviewed he is alert oriented x3 mood affect thought cognition are normal and has no audible wheezing or respiratory distress with conversation  ASSESSMENT & PLAN:    1. Atrial fibrillation paroxysmal stable no clinical recurrence he will remain off anticoagulant low-dose aspirin for stroke prophylaxis in his current beta-blocker.  I do not feel the necessity to bring him to my office for an EKG at this time 2. Hypertension stable continue current medications asked him to see the family member to check his blood pressure at home and contact us if out of range 3. Hyperlipidemia stable continue a statin at next visit he will need a CMP and lipid profile 4. CAD stable continue medical treatment he has had no anginal discomfort New York Heart Association class I and does not need an ischemia evaluation 5. At risk for falls improved he has had none recently and no trauma.  COVID-19 Education: The signs and symptoms of COVID-19 were discussed with the patient and how to seek care for testing (follow up with PCP or arrange E-visit).  The importance of social  distancing was discussed today.  Time:   Today, I have spent 28 minutes with the patient with telehealth technology discussing the above problems.     Medication Adjustments/Labs and Tests Ordered: Current medicines are reviewed at length with the patient today.  Concerns regarding medicines are outlined above.   Tests Ordered: No orders of the defined types were placed in this encounter.   Medication Changes: No orders of the defined types were placed in this encounter.   Disposition:  Follow up in 6 month(s)  Signed, Shirlee More, MD  05/04/2018 9:18 AM    [place]

## 2018-05-04 NOTE — Patient Instructions (Addendum)
Medication Instructions:  Your physician recommends that you continue on your current medications as directed. Please refer to the Current Medication list given to you today.  If you need a refill on your cardiac medications before your next appointment, please call your pharmacy.   Lab work: None  If you have labs (blood work) drawn today and your tests are completely normal, you will receive your results only by: Marland Kitchen MyChart Message (if you have MyChart) OR . A paper copy in the mail If you have any lab test that is abnormal or we need to change your treatment, we will call you to review the results.  Testing/Procedures: None  Follow-Up: At [facility], you and your health needs are our priority.  As part of our continuing mission to provide you with exceptional heart care, we have created designated Provider Care Teams.  These Care Teams include your primary Cardiologist (physician) and Advanced Practice Providers (APPs -  Physician Assistants and Nurse Practitioners) who all work together to provide you with the care you need, when you need it. You will need a follow up appointment in 6 months: Friday, 11/09/2018, at 11:00 am in the [place] office. . Please arrive at 10:45 am.

## 2018-06-20 DIAGNOSIS — Z23 Encounter for immunization: Secondary | ICD-10-CM | POA: Diagnosis not present

## 2018-06-20 DIAGNOSIS — Y92009 Unspecified place in unspecified non-institutional (private) residence as the place of occurrence of the external cause: Secondary | ICD-10-CM | POA: Diagnosis not present

## 2018-06-20 DIAGNOSIS — W1839XA Other fall on same level, initial encounter: Secondary | ICD-10-CM | POA: Diagnosis not present

## 2018-06-20 DIAGNOSIS — I959 Hypotension, unspecified: Secondary | ICD-10-CM | POA: Diagnosis not present

## 2018-06-20 DIAGNOSIS — S066X0A Traumatic subarachnoid hemorrhage without loss of consciousness, initial encounter: Secondary | ICD-10-CM | POA: Diagnosis not present

## 2018-06-20 DIAGNOSIS — M4312 Spondylolisthesis, cervical region: Secondary | ICD-10-CM | POA: Diagnosis not present

## 2018-06-20 DIAGNOSIS — Y998 Other external cause status: Secondary | ICD-10-CM | POA: Diagnosis not present

## 2018-06-20 DIAGNOSIS — S0081XA Abrasion of other part of head, initial encounter: Secondary | ICD-10-CM | POA: Diagnosis not present

## 2018-06-20 DIAGNOSIS — S066X9A Traumatic subarachnoid hemorrhage with loss of consciousness of unspecified duration, initial encounter: Secondary | ICD-10-CM | POA: Diagnosis not present

## 2018-06-20 DIAGNOSIS — S0101XA Laceration without foreign body of scalp, initial encounter: Secondary | ICD-10-CM | POA: Diagnosis not present

## 2018-06-20 DIAGNOSIS — S299XXA Unspecified injury of thorax, initial encounter: Secondary | ICD-10-CM | POA: Diagnosis not present

## 2018-06-21 DIAGNOSIS — E785 Hyperlipidemia, unspecified: Secondary | ICD-10-CM | POA: Diagnosis present

## 2018-06-21 DIAGNOSIS — I10 Essential (primary) hypertension: Secondary | ICD-10-CM | POA: Diagnosis present

## 2018-06-21 DIAGNOSIS — W19XXXA Unspecified fall, initial encounter: Secondary | ICD-10-CM | POA: Diagnosis not present

## 2018-06-21 DIAGNOSIS — I480 Paroxysmal atrial fibrillation: Secondary | ICD-10-CM | POA: Diagnosis present

## 2018-06-21 DIAGNOSIS — I609 Nontraumatic subarachnoid hemorrhage, unspecified: Secondary | ICD-10-CM | POA: Diagnosis not present

## 2018-06-21 DIAGNOSIS — R0902 Hypoxemia: Secondary | ICD-10-CM | POA: Diagnosis not present

## 2018-06-21 DIAGNOSIS — S61012A Laceration without foreign body of left thumb without damage to nail, initial encounter: Secondary | ICD-10-CM | POA: Diagnosis not present

## 2018-06-21 DIAGNOSIS — D62 Acute posthemorrhagic anemia: Secondary | ICD-10-CM | POA: Diagnosis not present

## 2018-06-21 DIAGNOSIS — I2510 Atherosclerotic heart disease of native coronary artery without angina pectoris: Secondary | ICD-10-CM | POA: Diagnosis present

## 2018-06-21 DIAGNOSIS — J984 Other disorders of lung: Secondary | ICD-10-CM | POA: Diagnosis present

## 2018-06-21 DIAGNOSIS — S41112A Laceration without foreign body of left upper arm, initial encounter: Secondary | ICD-10-CM | POA: Diagnosis not present

## 2018-06-21 DIAGNOSIS — M2550 Pain in unspecified joint: Secondary | ICD-10-CM | POA: Diagnosis not present

## 2018-06-21 DIAGNOSIS — Z79899 Other long term (current) drug therapy: Secondary | ICD-10-CM | POA: Diagnosis not present

## 2018-06-21 DIAGNOSIS — R52 Pain, unspecified: Secondary | ICD-10-CM | POA: Diagnosis not present

## 2018-06-21 DIAGNOSIS — K5900 Constipation, unspecified: Secondary | ICD-10-CM | POA: Diagnosis present

## 2018-06-21 DIAGNOSIS — M4854XA Collapsed vertebra, not elsewhere classified, thoracic region, initial encounter for fracture: Secondary | ICD-10-CM | POA: Diagnosis not present

## 2018-06-21 DIAGNOSIS — K551 Chronic vascular disorders of intestine: Secondary | ICD-10-CM | POA: Diagnosis not present

## 2018-06-21 DIAGNOSIS — S0003XA Contusion of scalp, initial encounter: Secondary | ICD-10-CM | POA: Diagnosis not present

## 2018-06-21 DIAGNOSIS — W1839XA Other fall on same level, initial encounter: Secondary | ICD-10-CM | POA: Diagnosis not present

## 2018-06-21 DIAGNOSIS — F05 Delirium due to known physiological condition: Secondary | ICD-10-CM | POA: Diagnosis not present

## 2018-06-21 DIAGNOSIS — I959 Hypotension, unspecified: Secondary | ICD-10-CM | POA: Diagnosis not present

## 2018-06-21 DIAGNOSIS — Z043 Encounter for examination and observation following other accident: Secondary | ICD-10-CM | POA: Diagnosis not present

## 2018-06-21 DIAGNOSIS — S80212A Abrasion, left knee, initial encounter: Secondary | ICD-10-CM | POA: Diagnosis not present

## 2018-06-21 DIAGNOSIS — E538 Deficiency of other specified B group vitamins: Secondary | ICD-10-CM | POA: Diagnosis not present

## 2018-06-21 DIAGNOSIS — E7800 Pure hypercholesterolemia, unspecified: Secondary | ICD-10-CM | POA: Diagnosis present

## 2018-06-21 DIAGNOSIS — Z7401 Bed confinement status: Secondary | ICD-10-CM | POA: Diagnosis not present

## 2018-06-21 DIAGNOSIS — I482 Chronic atrial fibrillation, unspecified: Secondary | ICD-10-CM | POA: Diagnosis not present

## 2018-06-21 DIAGNOSIS — R2681 Unsteadiness on feet: Secondary | ICD-10-CM | POA: Diagnosis not present

## 2018-06-21 DIAGNOSIS — S065X0A Traumatic subdural hemorrhage without loss of consciousness, initial encounter: Secondary | ICD-10-CM | POA: Diagnosis not present

## 2018-06-21 DIAGNOSIS — I252 Old myocardial infarction: Secondary | ICD-10-CM | POA: Diagnosis not present

## 2018-06-21 DIAGNOSIS — R937 Abnormal findings on diagnostic imaging of other parts of musculoskeletal system: Secondary | ICD-10-CM | POA: Diagnosis not present

## 2018-06-21 DIAGNOSIS — Z79890 Hormone replacement therapy (postmenopausal): Secondary | ICD-10-CM | POA: Diagnosis not present

## 2018-06-21 DIAGNOSIS — M9702XA Periprosthetic fracture around internal prosthetic left hip joint, initial encounter: Secondary | ICD-10-CM | POA: Diagnosis not present

## 2018-06-21 DIAGNOSIS — S2232XA Fracture of one rib, left side, initial encounter for closed fracture: Secondary | ICD-10-CM | POA: Diagnosis not present

## 2018-06-21 DIAGNOSIS — S51812A Laceration without foreign body of left forearm, initial encounter: Secondary | ICD-10-CM | POA: Diagnosis not present

## 2018-06-21 DIAGNOSIS — S0081XA Abrasion of other part of head, initial encounter: Secondary | ICD-10-CM | POA: Diagnosis not present

## 2018-06-21 DIAGNOSIS — S066X9A Traumatic subarachnoid hemorrhage with loss of consciousness of unspecified duration, initial encounter: Secondary | ICD-10-CM | POA: Diagnosis not present

## 2018-06-21 DIAGNOSIS — Z8673 Personal history of transient ischemic attack (TIA), and cerebral infarction without residual deficits: Secondary | ICD-10-CM | POA: Diagnosis not present

## 2018-06-21 DIAGNOSIS — F329 Major depressive disorder, single episode, unspecified: Secondary | ICD-10-CM | POA: Diagnosis present

## 2018-06-21 DIAGNOSIS — Z9189 Other specified personal risk factors, not elsewhere classified: Secondary | ICD-10-CM | POA: Diagnosis not present

## 2018-06-21 DIAGNOSIS — Y998 Other external cause status: Secondary | ICD-10-CM | POA: Diagnosis not present

## 2018-06-21 DIAGNOSIS — Z7982 Long term (current) use of aspirin: Secondary | ICD-10-CM | POA: Diagnosis not present

## 2018-06-21 DIAGNOSIS — S066X0A Traumatic subarachnoid hemorrhage without loss of consciousness, initial encounter: Secondary | ICD-10-CM | POA: Diagnosis not present

## 2018-06-21 DIAGNOSIS — Y92009 Unspecified place in unspecified non-institutional (private) residence as the place of occurrence of the external cause: Secondary | ICD-10-CM | POA: Diagnosis not present

## 2018-06-21 DIAGNOSIS — S065X9A Traumatic subdural hemorrhage with loss of consciousness of unspecified duration, initial encounter: Secondary | ICD-10-CM | POA: Diagnosis not present

## 2018-06-26 DIAGNOSIS — S066X9D Traumatic subarachnoid hemorrhage with loss of consciousness of unspecified duration, subsequent encounter: Secondary | ICD-10-CM | POA: Diagnosis not present

## 2018-06-26 DIAGNOSIS — S51002D Unspecified open wound of left elbow, subsequent encounter: Secondary | ICD-10-CM | POA: Diagnosis not present

## 2018-06-26 DIAGNOSIS — D469 Myelodysplastic syndrome, unspecified: Secondary | ICD-10-CM | POA: Diagnosis not present

## 2018-06-26 DIAGNOSIS — I10 Essential (primary) hypertension: Secondary | ICD-10-CM | POA: Diagnosis not present

## 2018-06-26 DIAGNOSIS — Z9181 History of falling: Secondary | ICD-10-CM | POA: Diagnosis not present

## 2018-06-26 DIAGNOSIS — E7800 Pure hypercholesterolemia, unspecified: Secondary | ICD-10-CM | POA: Diagnosis not present

## 2018-06-26 DIAGNOSIS — S2232XD Fracture of one rib, left side, subsequent encounter for fracture with routine healing: Secondary | ICD-10-CM | POA: Diagnosis not present

## 2018-06-26 DIAGNOSIS — I252 Old myocardial infarction: Secondary | ICD-10-CM | POA: Diagnosis not present

## 2018-06-26 DIAGNOSIS — S065X9D Traumatic subdural hemorrhage with loss of consciousness of unspecified duration, subsequent encounter: Secondary | ICD-10-CM | POA: Diagnosis not present

## 2018-06-26 DIAGNOSIS — Z79899 Other long term (current) drug therapy: Secondary | ICD-10-CM | POA: Diagnosis not present

## 2018-06-26 DIAGNOSIS — M1990 Unspecified osteoarthritis, unspecified site: Secondary | ICD-10-CM | POA: Diagnosis not present

## 2018-06-26 DIAGNOSIS — Z8673 Personal history of transient ischemic attack (TIA), and cerebral infarction without residual deficits: Secondary | ICD-10-CM | POA: Diagnosis not present

## 2018-06-26 DIAGNOSIS — Z955 Presence of coronary angioplasty implant and graft: Secondary | ICD-10-CM | POA: Diagnosis not present

## 2018-06-26 DIAGNOSIS — W19XXXD Unspecified fall, subsequent encounter: Secondary | ICD-10-CM | POA: Diagnosis not present

## 2018-06-26 DIAGNOSIS — I2510 Atherosclerotic heart disease of native coronary artery without angina pectoris: Secondary | ICD-10-CM | POA: Diagnosis not present

## 2018-06-26 DIAGNOSIS — S61412D Laceration without foreign body of left hand, subsequent encounter: Secondary | ICD-10-CM | POA: Diagnosis not present

## 2018-06-26 DIAGNOSIS — Z7902 Long term (current) use of antithrombotics/antiplatelets: Secondary | ICD-10-CM | POA: Diagnosis not present

## 2018-06-27 DIAGNOSIS — I10 Essential (primary) hypertension: Secondary | ICD-10-CM | POA: Diagnosis not present

## 2018-06-27 DIAGNOSIS — S61412A Laceration without foreign body of left hand, initial encounter: Secondary | ICD-10-CM | POA: Diagnosis not present

## 2018-06-27 DIAGNOSIS — S51812A Laceration without foreign body of left forearm, initial encounter: Secondary | ICD-10-CM | POA: Diagnosis not present

## 2018-06-27 DIAGNOSIS — I252 Old myocardial infarction: Secondary | ICD-10-CM | POA: Diagnosis not present

## 2018-06-27 DIAGNOSIS — S61402A Unspecified open wound of left hand, initial encounter: Secondary | ICD-10-CM | POA: Diagnosis not present

## 2018-06-27 DIAGNOSIS — S51802A Unspecified open wound of left forearm, initial encounter: Secondary | ICD-10-CM | POA: Diagnosis not present

## 2018-06-28 DIAGNOSIS — S2232XD Fracture of one rib, left side, subsequent encounter for fracture with routine healing: Secondary | ICD-10-CM | POA: Diagnosis not present

## 2018-06-28 DIAGNOSIS — S51002D Unspecified open wound of left elbow, subsequent encounter: Secondary | ICD-10-CM | POA: Diagnosis not present

## 2018-06-28 DIAGNOSIS — S61412D Laceration without foreign body of left hand, subsequent encounter: Secondary | ICD-10-CM | POA: Diagnosis not present

## 2018-06-28 DIAGNOSIS — S066X9D Traumatic subarachnoid hemorrhage with loss of consciousness of unspecified duration, subsequent encounter: Secondary | ICD-10-CM | POA: Diagnosis not present

## 2018-06-28 DIAGNOSIS — D469 Myelodysplastic syndrome, unspecified: Secondary | ICD-10-CM | POA: Diagnosis not present

## 2018-06-28 DIAGNOSIS — S065X9D Traumatic subdural hemorrhage with loss of consciousness of unspecified duration, subsequent encounter: Secondary | ICD-10-CM | POA: Diagnosis not present

## 2018-07-02 DIAGNOSIS — S066X9D Traumatic subarachnoid hemorrhage with loss of consciousness of unspecified duration, subsequent encounter: Secondary | ICD-10-CM | POA: Diagnosis not present

## 2018-07-02 DIAGNOSIS — S2232XD Fracture of one rib, left side, subsequent encounter for fracture with routine healing: Secondary | ICD-10-CM | POA: Diagnosis not present

## 2018-07-02 DIAGNOSIS — D469 Myelodysplastic syndrome, unspecified: Secondary | ICD-10-CM | POA: Diagnosis not present

## 2018-07-02 DIAGNOSIS — S61412D Laceration without foreign body of left hand, subsequent encounter: Secondary | ICD-10-CM | POA: Diagnosis not present

## 2018-07-02 DIAGNOSIS — S065X9D Traumatic subdural hemorrhage with loss of consciousness of unspecified duration, subsequent encounter: Secondary | ICD-10-CM | POA: Diagnosis not present

## 2018-07-02 DIAGNOSIS — S51002D Unspecified open wound of left elbow, subsequent encounter: Secondary | ICD-10-CM | POA: Diagnosis not present

## 2018-07-03 DIAGNOSIS — D649 Anemia, unspecified: Secondary | ICD-10-CM | POA: Diagnosis not present

## 2018-07-03 DIAGNOSIS — Z6823 Body mass index (BMI) 23.0-23.9, adult: Secondary | ICD-10-CM | POA: Diagnosis not present

## 2018-07-03 DIAGNOSIS — I10 Essential (primary) hypertension: Secondary | ICD-10-CM | POA: Diagnosis not present

## 2018-07-03 DIAGNOSIS — I609 Nontraumatic subarachnoid hemorrhage, unspecified: Secondary | ICD-10-CM | POA: Diagnosis not present

## 2018-07-03 DIAGNOSIS — R9389 Abnormal findings on diagnostic imaging of other specified body structures: Secondary | ICD-10-CM | POA: Diagnosis not present

## 2018-07-03 DIAGNOSIS — S41112A Laceration without foreign body of left upper arm, initial encounter: Secondary | ICD-10-CM | POA: Diagnosis not present

## 2018-07-04 DIAGNOSIS — S065X9D Traumatic subdural hemorrhage with loss of consciousness of unspecified duration, subsequent encounter: Secondary | ICD-10-CM | POA: Diagnosis not present

## 2018-07-04 DIAGNOSIS — S066X9D Traumatic subarachnoid hemorrhage with loss of consciousness of unspecified duration, subsequent encounter: Secondary | ICD-10-CM | POA: Diagnosis not present

## 2018-07-04 DIAGNOSIS — S61412D Laceration without foreign body of left hand, subsequent encounter: Secondary | ICD-10-CM | POA: Diagnosis not present

## 2018-07-04 DIAGNOSIS — S51002D Unspecified open wound of left elbow, subsequent encounter: Secondary | ICD-10-CM | POA: Diagnosis not present

## 2018-07-04 DIAGNOSIS — D469 Myelodysplastic syndrome, unspecified: Secondary | ICD-10-CM | POA: Diagnosis not present

## 2018-07-04 DIAGNOSIS — S2232XD Fracture of one rib, left side, subsequent encounter for fracture with routine healing: Secondary | ICD-10-CM | POA: Diagnosis not present

## 2018-07-05 DIAGNOSIS — S61412A Laceration without foreign body of left hand, initial encounter: Secondary | ICD-10-CM | POA: Diagnosis not present

## 2018-07-05 DIAGNOSIS — S51812A Laceration without foreign body of left forearm, initial encounter: Secondary | ICD-10-CM | POA: Diagnosis not present

## 2018-07-05 DIAGNOSIS — D469 Myelodysplastic syndrome, unspecified: Secondary | ICD-10-CM | POA: Diagnosis not present

## 2018-07-05 DIAGNOSIS — S51802A Unspecified open wound of left forearm, initial encounter: Secondary | ICD-10-CM | POA: Diagnosis not present

## 2018-07-05 DIAGNOSIS — R937 Abnormal findings on diagnostic imaging of other parts of musculoskeletal system: Secondary | ICD-10-CM | POA: Diagnosis not present

## 2018-07-05 DIAGNOSIS — S61402A Unspecified open wound of left hand, initial encounter: Secondary | ICD-10-CM | POA: Diagnosis not present

## 2018-07-05 DIAGNOSIS — Z125 Encounter for screening for malignant neoplasm of prostate: Secondary | ICD-10-CM | POA: Diagnosis not present

## 2018-07-09 DIAGNOSIS — D469 Myelodysplastic syndrome, unspecified: Secondary | ICD-10-CM | POA: Diagnosis not present

## 2018-07-09 DIAGNOSIS — S065X9D Traumatic subdural hemorrhage with loss of consciousness of unspecified duration, subsequent encounter: Secondary | ICD-10-CM | POA: Diagnosis not present

## 2018-07-09 DIAGNOSIS — S61412D Laceration without foreign body of left hand, subsequent encounter: Secondary | ICD-10-CM | POA: Diagnosis not present

## 2018-07-09 DIAGNOSIS — S51002D Unspecified open wound of left elbow, subsequent encounter: Secondary | ICD-10-CM | POA: Diagnosis not present

## 2018-07-09 DIAGNOSIS — S066X9D Traumatic subarachnoid hemorrhage with loss of consciousness of unspecified duration, subsequent encounter: Secondary | ICD-10-CM | POA: Diagnosis not present

## 2018-07-09 DIAGNOSIS — S2232XD Fracture of one rib, left side, subsequent encounter for fracture with routine healing: Secondary | ICD-10-CM | POA: Diagnosis not present

## 2018-07-10 DIAGNOSIS — S51802A Unspecified open wound of left forearm, initial encounter: Secondary | ICD-10-CM | POA: Diagnosis not present

## 2018-07-11 DIAGNOSIS — S61412D Laceration without foreign body of left hand, subsequent encounter: Secondary | ICD-10-CM | POA: Diagnosis not present

## 2018-07-11 DIAGNOSIS — S2232XD Fracture of one rib, left side, subsequent encounter for fracture with routine healing: Secondary | ICD-10-CM | POA: Diagnosis not present

## 2018-07-11 DIAGNOSIS — D469 Myelodysplastic syndrome, unspecified: Secondary | ICD-10-CM | POA: Diagnosis not present

## 2018-07-11 DIAGNOSIS — S51002D Unspecified open wound of left elbow, subsequent encounter: Secondary | ICD-10-CM | POA: Diagnosis not present

## 2018-07-11 DIAGNOSIS — S066X9D Traumatic subarachnoid hemorrhage with loss of consciousness of unspecified duration, subsequent encounter: Secondary | ICD-10-CM | POA: Diagnosis not present

## 2018-07-11 DIAGNOSIS — S065X9D Traumatic subdural hemorrhage with loss of consciousness of unspecified duration, subsequent encounter: Secondary | ICD-10-CM | POA: Diagnosis not present

## 2018-07-20 DIAGNOSIS — S51002D Unspecified open wound of left elbow, subsequent encounter: Secondary | ICD-10-CM | POA: Diagnosis not present

## 2018-07-20 DIAGNOSIS — D469 Myelodysplastic syndrome, unspecified: Secondary | ICD-10-CM | POA: Diagnosis not present

## 2018-07-20 DIAGNOSIS — S61412D Laceration without foreign body of left hand, subsequent encounter: Secondary | ICD-10-CM | POA: Diagnosis not present

## 2018-07-20 DIAGNOSIS — S065X9D Traumatic subdural hemorrhage with loss of consciousness of unspecified duration, subsequent encounter: Secondary | ICD-10-CM | POA: Diagnosis not present

## 2018-07-20 DIAGNOSIS — S066X9D Traumatic subarachnoid hemorrhage with loss of consciousness of unspecified duration, subsequent encounter: Secondary | ICD-10-CM | POA: Diagnosis not present

## 2018-07-20 DIAGNOSIS — S2232XD Fracture of one rib, left side, subsequent encounter for fracture with routine healing: Secondary | ICD-10-CM | POA: Diagnosis not present

## 2018-07-23 DIAGNOSIS — S51002D Unspecified open wound of left elbow, subsequent encounter: Secondary | ICD-10-CM | POA: Diagnosis not present

## 2018-07-23 DIAGNOSIS — S2232XD Fracture of one rib, left side, subsequent encounter for fracture with routine healing: Secondary | ICD-10-CM | POA: Diagnosis not present

## 2018-07-23 DIAGNOSIS — D469 Myelodysplastic syndrome, unspecified: Secondary | ICD-10-CM | POA: Diagnosis not present

## 2018-07-23 DIAGNOSIS — S61412D Laceration without foreign body of left hand, subsequent encounter: Secondary | ICD-10-CM | POA: Diagnosis not present

## 2018-07-23 DIAGNOSIS — S065X9D Traumatic subdural hemorrhage with loss of consciousness of unspecified duration, subsequent encounter: Secondary | ICD-10-CM | POA: Diagnosis not present

## 2018-07-23 DIAGNOSIS — S066X9D Traumatic subarachnoid hemorrhage with loss of consciousness of unspecified duration, subsequent encounter: Secondary | ICD-10-CM | POA: Diagnosis not present

## 2018-07-26 DIAGNOSIS — E7800 Pure hypercholesterolemia, unspecified: Secondary | ICD-10-CM | POA: Diagnosis not present

## 2018-07-26 DIAGNOSIS — S51002D Unspecified open wound of left elbow, subsequent encounter: Secondary | ICD-10-CM | POA: Diagnosis not present

## 2018-07-26 DIAGNOSIS — I10 Essential (primary) hypertension: Secondary | ICD-10-CM | POA: Diagnosis not present

## 2018-07-26 DIAGNOSIS — Z955 Presence of coronary angioplasty implant and graft: Secondary | ICD-10-CM | POA: Diagnosis not present

## 2018-07-26 DIAGNOSIS — M1990 Unspecified osteoarthritis, unspecified site: Secondary | ICD-10-CM | POA: Diagnosis not present

## 2018-07-26 DIAGNOSIS — S066X9D Traumatic subarachnoid hemorrhage with loss of consciousness of unspecified duration, subsequent encounter: Secondary | ICD-10-CM | POA: Diagnosis not present

## 2018-07-26 DIAGNOSIS — Z8673 Personal history of transient ischemic attack (TIA), and cerebral infarction without residual deficits: Secondary | ICD-10-CM | POA: Diagnosis not present

## 2018-07-26 DIAGNOSIS — Z79899 Other long term (current) drug therapy: Secondary | ICD-10-CM | POA: Diagnosis not present

## 2018-07-26 DIAGNOSIS — Z9181 History of falling: Secondary | ICD-10-CM | POA: Diagnosis not present

## 2018-07-26 DIAGNOSIS — S61412D Laceration without foreign body of left hand, subsequent encounter: Secondary | ICD-10-CM | POA: Diagnosis not present

## 2018-07-26 DIAGNOSIS — W19XXXD Unspecified fall, subsequent encounter: Secondary | ICD-10-CM | POA: Diagnosis not present

## 2018-07-26 DIAGNOSIS — I252 Old myocardial infarction: Secondary | ICD-10-CM | POA: Diagnosis not present

## 2018-07-26 DIAGNOSIS — D469 Myelodysplastic syndrome, unspecified: Secondary | ICD-10-CM | POA: Diagnosis not present

## 2018-07-26 DIAGNOSIS — S065X9D Traumatic subdural hemorrhage with loss of consciousness of unspecified duration, subsequent encounter: Secondary | ICD-10-CM | POA: Diagnosis not present

## 2018-07-26 DIAGNOSIS — S2232XD Fracture of one rib, left side, subsequent encounter for fracture with routine healing: Secondary | ICD-10-CM | POA: Diagnosis not present

## 2018-07-26 DIAGNOSIS — I2510 Atherosclerotic heart disease of native coronary artery without angina pectoris: Secondary | ICD-10-CM | POA: Diagnosis not present

## 2018-07-26 DIAGNOSIS — Z7902 Long term (current) use of antithrombotics/antiplatelets: Secondary | ICD-10-CM | POA: Diagnosis not present

## 2018-07-30 DIAGNOSIS — S51812A Laceration without foreign body of left forearm, initial encounter: Secondary | ICD-10-CM | POA: Diagnosis not present

## 2018-07-30 DIAGNOSIS — S61402A Unspecified open wound of left hand, initial encounter: Secondary | ICD-10-CM | POA: Diagnosis not present

## 2018-07-31 DIAGNOSIS — D46A Refractory cytopenia with multilineage dysplasia: Secondary | ICD-10-CM

## 2018-08-02 DIAGNOSIS — S065X9D Traumatic subdural hemorrhage with loss of consciousness of unspecified duration, subsequent encounter: Secondary | ICD-10-CM | POA: Diagnosis not present

## 2018-08-02 DIAGNOSIS — S51002D Unspecified open wound of left elbow, subsequent encounter: Secondary | ICD-10-CM | POA: Diagnosis not present

## 2018-08-02 DIAGNOSIS — D469 Myelodysplastic syndrome, unspecified: Secondary | ICD-10-CM | POA: Diagnosis not present

## 2018-08-02 DIAGNOSIS — S066X9D Traumatic subarachnoid hemorrhage with loss of consciousness of unspecified duration, subsequent encounter: Secondary | ICD-10-CM | POA: Diagnosis not present

## 2018-08-02 DIAGNOSIS — S61412D Laceration without foreign body of left hand, subsequent encounter: Secondary | ICD-10-CM | POA: Diagnosis not present

## 2018-08-02 DIAGNOSIS — S2232XD Fracture of one rib, left side, subsequent encounter for fracture with routine healing: Secondary | ICD-10-CM | POA: Diagnosis not present

## 2018-08-03 DIAGNOSIS — S065X9A Traumatic subdural hemorrhage with loss of consciousness of unspecified duration, initial encounter: Secondary | ICD-10-CM | POA: Diagnosis not present

## 2018-08-03 DIAGNOSIS — S065X9D Traumatic subdural hemorrhage with loss of consciousness of unspecified duration, subsequent encounter: Secondary | ICD-10-CM | POA: Diagnosis not present

## 2018-08-03 DIAGNOSIS — I609 Nontraumatic subarachnoid hemorrhage, unspecified: Secondary | ICD-10-CM | POA: Diagnosis not present

## 2018-08-07 DIAGNOSIS — S51002D Unspecified open wound of left elbow, subsequent encounter: Secondary | ICD-10-CM | POA: Diagnosis not present

## 2018-08-07 DIAGNOSIS — S065X9D Traumatic subdural hemorrhage with loss of consciousness of unspecified duration, subsequent encounter: Secondary | ICD-10-CM | POA: Diagnosis not present

## 2018-08-07 DIAGNOSIS — S2232XD Fracture of one rib, left side, subsequent encounter for fracture with routine healing: Secondary | ICD-10-CM | POA: Diagnosis not present

## 2018-08-07 DIAGNOSIS — D469 Myelodysplastic syndrome, unspecified: Secondary | ICD-10-CM | POA: Diagnosis not present

## 2018-08-07 DIAGNOSIS — S066X9D Traumatic subarachnoid hemorrhage with loss of consciousness of unspecified duration, subsequent encounter: Secondary | ICD-10-CM | POA: Diagnosis not present

## 2018-08-07 DIAGNOSIS — S61412D Laceration without foreign body of left hand, subsequent encounter: Secondary | ICD-10-CM | POA: Diagnosis not present

## 2018-08-14 DIAGNOSIS — S51812A Laceration without foreign body of left forearm, initial encounter: Secondary | ICD-10-CM | POA: Diagnosis not present

## 2018-08-14 DIAGNOSIS — L928 Other granulomatous disorders of the skin and subcutaneous tissue: Secondary | ICD-10-CM | POA: Diagnosis not present

## 2018-08-14 DIAGNOSIS — S51802A Unspecified open wound of left forearm, initial encounter: Secondary | ICD-10-CM | POA: Diagnosis not present

## 2018-08-14 DIAGNOSIS — S61402A Unspecified open wound of left hand, initial encounter: Secondary | ICD-10-CM | POA: Diagnosis not present

## 2018-08-15 DIAGNOSIS — S51002D Unspecified open wound of left elbow, subsequent encounter: Secondary | ICD-10-CM | POA: Diagnosis not present

## 2018-08-15 DIAGNOSIS — S2232XD Fracture of one rib, left side, subsequent encounter for fracture with routine healing: Secondary | ICD-10-CM | POA: Diagnosis not present

## 2018-08-15 DIAGNOSIS — D469 Myelodysplastic syndrome, unspecified: Secondary | ICD-10-CM | POA: Diagnosis not present

## 2018-08-15 DIAGNOSIS — S065X9D Traumatic subdural hemorrhage with loss of consciousness of unspecified duration, subsequent encounter: Secondary | ICD-10-CM | POA: Diagnosis not present

## 2018-08-15 DIAGNOSIS — S61412D Laceration without foreign body of left hand, subsequent encounter: Secondary | ICD-10-CM | POA: Diagnosis not present

## 2018-08-15 DIAGNOSIS — S066X9D Traumatic subarachnoid hemorrhage with loss of consciousness of unspecified duration, subsequent encounter: Secondary | ICD-10-CM | POA: Diagnosis not present

## 2018-08-21 DIAGNOSIS — S51812A Laceration without foreign body of left forearm, initial encounter: Secondary | ICD-10-CM | POA: Diagnosis not present

## 2018-08-21 DIAGNOSIS — S51802A Unspecified open wound of left forearm, initial encounter: Secondary | ICD-10-CM | POA: Diagnosis not present

## 2018-08-23 DIAGNOSIS — D469 Myelodysplastic syndrome, unspecified: Secondary | ICD-10-CM | POA: Diagnosis not present

## 2018-08-23 DIAGNOSIS — S2232XD Fracture of one rib, left side, subsequent encounter for fracture with routine healing: Secondary | ICD-10-CM | POA: Diagnosis not present

## 2018-08-23 DIAGNOSIS — S51002D Unspecified open wound of left elbow, subsequent encounter: Secondary | ICD-10-CM | POA: Diagnosis not present

## 2018-08-23 DIAGNOSIS — S61412D Laceration without foreign body of left hand, subsequent encounter: Secondary | ICD-10-CM | POA: Diagnosis not present

## 2018-08-23 DIAGNOSIS — S065X9D Traumatic subdural hemorrhage with loss of consciousness of unspecified duration, subsequent encounter: Secondary | ICD-10-CM | POA: Diagnosis not present

## 2018-08-23 DIAGNOSIS — S066X9D Traumatic subarachnoid hemorrhage with loss of consciousness of unspecified duration, subsequent encounter: Secondary | ICD-10-CM | POA: Diagnosis not present

## 2018-08-28 DIAGNOSIS — S51802A Unspecified open wound of left forearm, initial encounter: Secondary | ICD-10-CM | POA: Diagnosis not present

## 2018-08-28 DIAGNOSIS — L928 Other granulomatous disorders of the skin and subcutaneous tissue: Secondary | ICD-10-CM | POA: Diagnosis not present

## 2018-09-05 DIAGNOSIS — S51802A Unspecified open wound of left forearm, initial encounter: Secondary | ICD-10-CM | POA: Diagnosis not present

## 2018-09-05 DIAGNOSIS — S61402A Unspecified open wound of left hand, initial encounter: Secondary | ICD-10-CM | POA: Diagnosis not present

## 2018-09-05 DIAGNOSIS — S51801A Unspecified open wound of right forearm, initial encounter: Secondary | ICD-10-CM | POA: Diagnosis not present

## 2018-09-12 DIAGNOSIS — S51802A Unspecified open wound of left forearm, initial encounter: Secondary | ICD-10-CM | POA: Diagnosis not present

## 2018-09-12 DIAGNOSIS — S61402A Unspecified open wound of left hand, initial encounter: Secondary | ICD-10-CM | POA: Diagnosis not present

## 2018-09-12 DIAGNOSIS — S51812A Laceration without foreign body of left forearm, initial encounter: Secondary | ICD-10-CM | POA: Diagnosis not present

## 2018-09-19 DIAGNOSIS — S51802A Unspecified open wound of left forearm, initial encounter: Secondary | ICD-10-CM | POA: Diagnosis not present

## 2018-10-04 DIAGNOSIS — S51802A Unspecified open wound of left forearm, initial encounter: Secondary | ICD-10-CM | POA: Diagnosis not present

## 2018-10-04 DIAGNOSIS — S61402A Unspecified open wound of left hand, initial encounter: Secondary | ICD-10-CM | POA: Diagnosis not present

## 2018-10-17 DIAGNOSIS — Z23 Encounter for immunization: Secondary | ICD-10-CM | POA: Diagnosis not present

## 2018-11-09 ENCOUNTER — Ambulatory Visit: Payer: Medicare Other | Admitting: Cardiology

## 2018-11-09 DIAGNOSIS — Z79899 Other long term (current) drug therapy: Secondary | ICD-10-CM | POA: Diagnosis not present

## 2018-11-09 DIAGNOSIS — Z1339 Encounter for screening examination for other mental health and behavioral disorders: Secondary | ICD-10-CM | POA: Diagnosis not present

## 2018-11-09 DIAGNOSIS — L309 Dermatitis, unspecified: Secondary | ICD-10-CM | POA: Diagnosis not present

## 2018-11-09 DIAGNOSIS — E785 Hyperlipidemia, unspecified: Secondary | ICD-10-CM | POA: Diagnosis not present

## 2018-11-09 DIAGNOSIS — E039 Hypothyroidism, unspecified: Secondary | ICD-10-CM | POA: Diagnosis not present

## 2018-11-09 DIAGNOSIS — Z1331 Encounter for screening for depression: Secondary | ICD-10-CM | POA: Diagnosis not present

## 2018-11-09 DIAGNOSIS — I10 Essential (primary) hypertension: Secondary | ICD-10-CM | POA: Diagnosis not present

## 2018-11-09 DIAGNOSIS — Z0000 Encounter for general adult medical examination without abnormal findings: Secondary | ICD-10-CM | POA: Diagnosis not present

## 2018-12-26 NOTE — Telephone Encounter (Signed)
completed

## 2018-12-27 DIAGNOSIS — R0602 Shortness of breath: Secondary | ICD-10-CM | POA: Diagnosis not present

## 2018-12-28 DIAGNOSIS — U071 COVID-19: Secondary | ICD-10-CM | POA: Diagnosis not present

## 2018-12-28 DIAGNOSIS — I4891 Unspecified atrial fibrillation: Secondary | ICD-10-CM | POA: Diagnosis not present

## 2018-12-28 DIAGNOSIS — E876 Hypokalemia: Secondary | ICD-10-CM | POA: Diagnosis not present

## 2018-12-28 NOTE — Significant Event (Signed)
85 year old with prior CVA, MI/CAD, HTN, myelodysplasia who presented to [facility] with progressive shortness of breath.  O2 sat 82% on RA, now on 6.5 LPM.  Temperature 99.5 F, 36, BP 121/71 Procalcitonin 0.3, D-dimer 4000 ABG pH 7.5, PCO2 31, PO2 62 on 5.5LPM O2 CTA negative for PE, consistent with Covid  Albuterol, dexamethasone, Lovenox and remdesivir have been ordered

## 2018-12-29 ENCOUNTER — Other Ambulatory Visit: Payer: Self-pay

## 2018-12-29 ENCOUNTER — Encounter (HOSPITAL_COMMUNITY): Payer: Self-pay | Admitting: Internal Medicine

## 2018-12-29 ENCOUNTER — Inpatient Hospital Stay (HOSPITAL_COMMUNITY)
Admission: AD | Admit: 2018-12-29 | Discharge: 2019-02-04 | DRG: 177 | Disposition: E | Payer: Medicare Other | Source: Other Acute Inpatient Hospital | Attending: Family Medicine | Admitting: Family Medicine

## 2018-12-29 ENCOUNTER — Inpatient Hospital Stay (HOSPITAL_COMMUNITY): Payer: Medicare Other

## 2018-12-29 DIAGNOSIS — Z515 Encounter for palliative care: Secondary | ICD-10-CM | POA: Diagnosis not present

## 2018-12-29 DIAGNOSIS — R54 Age-related physical debility: Secondary | ICD-10-CM | POA: Diagnosis present

## 2018-12-29 DIAGNOSIS — I82432 Acute embolism and thrombosis of left popliteal vein: Secondary | ICD-10-CM | POA: Diagnosis present

## 2018-12-29 DIAGNOSIS — Z7982 Long term (current) use of aspirin: Secondary | ICD-10-CM | POA: Diagnosis not present

## 2018-12-29 DIAGNOSIS — D469 Myelodysplastic syndrome, unspecified: Secondary | ICD-10-CM | POA: Diagnosis present

## 2018-12-29 DIAGNOSIS — E039 Hypothyroidism, unspecified: Secondary | ICD-10-CM | POA: Diagnosis present

## 2018-12-29 DIAGNOSIS — J690 Pneumonitis due to inhalation of food and vomit: Secondary | ICD-10-CM | POA: Diagnosis not present

## 2018-12-29 DIAGNOSIS — I480 Paroxysmal atrial fibrillation: Secondary | ICD-10-CM | POA: Diagnosis present

## 2018-12-29 DIAGNOSIS — E860 Dehydration: Secondary | ICD-10-CM | POA: Diagnosis present

## 2018-12-29 DIAGNOSIS — R7989 Other specified abnormal findings of blood chemistry: Secondary | ICD-10-CM | POA: Diagnosis present

## 2018-12-29 DIAGNOSIS — I2510 Atherosclerotic heart disease of native coronary artery without angina pectoris: Secondary | ICD-10-CM | POA: Diagnosis present

## 2018-12-29 DIAGNOSIS — R918 Other nonspecific abnormal finding of lung field: Secondary | ICD-10-CM | POA: Diagnosis not present

## 2018-12-29 DIAGNOSIS — J189 Pneumonia, unspecified organism: Secondary | ICD-10-CM

## 2018-12-29 DIAGNOSIS — J9601 Acute respiratory failure with hypoxia: Secondary | ICD-10-CM

## 2018-12-29 DIAGNOSIS — Z833 Family history of diabetes mellitus: Secondary | ICD-10-CM | POA: Diagnosis not present

## 2018-12-29 DIAGNOSIS — N179 Acute kidney failure, unspecified: Secondary | ICD-10-CM | POA: Diagnosis not present

## 2018-12-29 DIAGNOSIS — Z79890 Hormone replacement therapy (postmenopausal): Secondary | ICD-10-CM

## 2018-12-29 DIAGNOSIS — Z66 Do not resuscitate: Secondary | ICD-10-CM | POA: Diagnosis not present

## 2018-12-29 DIAGNOSIS — U071 COVID-19: Principal | ICD-10-CM

## 2018-12-29 DIAGNOSIS — J1282 Pneumonia due to coronavirus disease 2019: Secondary | ICD-10-CM | POA: Diagnosis present

## 2018-12-29 DIAGNOSIS — Z880 Allergy status to penicillin: Secondary | ICD-10-CM

## 2018-12-29 DIAGNOSIS — R0902 Hypoxemia: Secondary | ICD-10-CM | POA: Diagnosis not present

## 2018-12-29 DIAGNOSIS — Z8249 Family history of ischemic heart disease and other diseases of the circulatory system: Secondary | ICD-10-CM | POA: Diagnosis not present

## 2018-12-29 DIAGNOSIS — E782 Mixed hyperlipidemia: Secondary | ICD-10-CM

## 2018-12-29 DIAGNOSIS — E785 Hyperlipidemia, unspecified: Secondary | ICD-10-CM | POA: Diagnosis present

## 2018-12-29 DIAGNOSIS — G9341 Metabolic encephalopathy: Secondary | ICD-10-CM | POA: Diagnosis not present

## 2018-12-29 DIAGNOSIS — I10 Essential (primary) hypertension: Secondary | ICD-10-CM | POA: Diagnosis present

## 2018-12-29 DIAGNOSIS — Z8673 Personal history of transient ischemic attack (TIA), and cerebral infarction without residual deficits: Secondary | ICD-10-CM

## 2018-12-29 DIAGNOSIS — R791 Abnormal coagulation profile: Secondary | ICD-10-CM | POA: Diagnosis not present

## 2018-12-29 LAB — C-REACTIVE PROTEIN: CRP: 13.6 mg/dL — ABNORMAL HIGH (ref <1.0)

## 2018-12-29 LAB — CBC WITH DIFFERENTIAL/PLATELET
Abs Immature Granulocytes: 0.07 10*3/uL (ref 0.00–0.07)
Basophils Absolute: 0 10*3/uL (ref 0.0–0.1)
Basophils Relative: 0 %
Eosinophils Absolute: 0 10*3/uL (ref 0.0–0.5)
Eosinophils Relative: 0 %
HCT: 33.7 % — ABNORMAL LOW (ref 39.0–52.0)
Hemoglobin: 11.5 g/dL — ABNORMAL LOW (ref 13.0–17.0)
Immature Granulocytes: 1 %
Lymphocytes Relative: 8 %
Lymphs Abs: 0.7 10*3/uL (ref 0.7–4.0)
MCH: 32 pg (ref 26.0–34.0)
MCHC: 34.1 g/dL (ref 30.0–36.0)
MCV: 93.9 fL (ref 80.0–100.0)
Monocytes Absolute: 0.4 10*3/uL (ref 0.1–1.0)
Monocytes Relative: 5 %
Neutro Abs: 7.5 10*3/uL (ref 1.7–7.7)
Neutrophils Relative %: 86 %
Platelets: 434 10*3/uL — ABNORMAL HIGH (ref 150–400)
RBC: 3.59 MIL/uL — ABNORMAL LOW (ref 4.22–5.81)
RDW: 13.7 % (ref 11.5–15.5)
WBC: 8.7 10*3/uL (ref 4.0–10.5)
nRBC: 0 % (ref 0.0–0.2)

## 2018-12-29 LAB — BASIC METABOLIC PANEL
Anion gap: 12 (ref 5–15)
BUN: 24 mg/dL — ABNORMAL HIGH (ref 8–23)
CO2: 20 mmol/L — ABNORMAL LOW (ref 22–32)
Calcium: 7.8 mg/dL — ABNORMAL LOW (ref 8.9–10.3)
Chloride: 109 mmol/L (ref 98–111)
Creatinine, Ser: 0.95 mg/dL (ref 0.61–1.24)
GFR calc Af Amer: >60 mL/min (ref >60)
GFR calc non Af Amer: >60 mL/min (ref >60)
Glucose, Bld: 106 mg/dL — ABNORMAL HIGH (ref 70–99)
Potassium: 4.1 mmol/L (ref 3.5–5.1)
Sodium: 141 mmol/L (ref 135–145)

## 2018-12-29 LAB — HEPATIC FUNCTION PANEL
ALT: 17 U/L (ref 0–44)
AST: 37 U/L (ref 15–41)
Albumin: 2.1 g/dL — ABNORMAL LOW (ref 3.5–5.0)
Alkaline Phosphatase: 67 U/L (ref 38–126)
Bilirubin, Direct: 0.2 mg/dL (ref 0.0–0.2)
Indirect Bilirubin: 0.6 mg/dL (ref 0.3–0.9)
Total Bilirubin: 0.8 mg/dL (ref 0.3–1.2)
Total Protein: 6.1 g/dL — ABNORMAL LOW (ref 6.5–8.1)

## 2018-12-29 LAB — ABO/RH: ABO/RH(D): O NEG

## 2018-12-29 LAB — BRAIN NATRIURETIC PEPTIDE: B Natriuretic Peptide: 215.7 pg/mL — ABNORMAL HIGH (ref 0.0–100.0)

## 2018-12-29 LAB — FERRITIN: Ferritin: 385 ng/mL — ABNORMAL HIGH (ref 24–336)

## 2018-12-29 LAB — PROCALCITONIN: Procalcitonin: 0.25 ng/mL

## 2018-12-29 MED ORDER — DEXAMETHASONE 6 MG PO TABS
6.0000 mg | ORAL_TABLET | ORAL | Status: AC
  Administered 2018-12-29 – 2019-01-07 (×10): 6 mg via ORAL
  Filled 2018-12-29 (×10): qty 1

## 2018-12-29 MED ORDER — ASPIRIN EC 81 MG PO TBEC
81.0000 mg | DELAYED_RELEASE_TABLET | Freq: Every day | ORAL | Status: DC
  Administered 2018-12-30 – 2019-01-04 (×6): 81 mg via ORAL
  Filled 2018-12-29 (×6): qty 1

## 2018-12-29 MED ORDER — ASCORBIC ACID 500 MG PO TABS
500.0000 mg | ORAL_TABLET | Freq: Every day | ORAL | Status: DC
  Administered 2018-12-29 – 2019-01-09 (×12): 500 mg via ORAL
  Filled 2018-12-29 (×12): qty 1

## 2018-12-29 MED ORDER — FAMOTIDINE 20 MG PO TABS
10.0000 mg | ORAL_TABLET | Freq: Every day | ORAL | Status: DC
  Administered 2018-12-30 – 2019-01-09 (×11): 10 mg via ORAL
  Filled 2018-12-29 (×11): qty 1

## 2018-12-29 MED ORDER — IPRATROPIUM-ALBUTEROL 20-100 MCG/ACT IN AERS
1.0000 | INHALATION_SPRAY | Freq: Four times a day (QID) | RESPIRATORY_TRACT | Status: DC
  Administered 2018-12-29 – 2019-01-09 (×42): 1 via RESPIRATORY_TRACT
  Filled 2018-12-29: qty 4

## 2018-12-29 MED ORDER — ACETAMINOPHEN 325 MG PO TABS: 650.0000 mg | ORAL_TABLET | Freq: Four times a day (QID) | ORAL | Status: DC | PRN

## 2018-12-29 MED ORDER — ALPRAZOLAM 0.25 MG PO TABS
0.2500 mg | ORAL_TABLET | Freq: Two times a day (BID) | ORAL | Status: DC | PRN
  Administered 2018-12-30 – 2019-01-08 (×3): 0.25 mg via ORAL
  Filled 2018-12-29 (×3): qty 1

## 2018-12-29 MED ORDER — HYDROCOD POLST-CPM POLST ER 10-8 MG/5ML PO SUER: 5.0000 mL | Freq: Two times a day (BID) | ORAL | Status: DC | PRN

## 2018-12-29 MED ORDER — PAROXETINE HCL 20 MG PO TABS
20.0000 mg | ORAL_TABLET | Freq: Every morning | ORAL | Status: DC
  Administered 2018-12-30 – 2019-01-09 (×11): 20 mg via ORAL
  Filled 2018-12-29 (×11): qty 1

## 2018-12-29 MED ORDER — SODIUM CHLORIDE 0.9 % IV SOLN
100.0000 mg | Freq: Every day | INTRAVENOUS | Status: AC
  Administered 2018-12-29 – 2018-12-31 (×3): 100 mg via INTRAVENOUS
  Filled 2018-12-29 (×3): qty 20

## 2018-12-29 MED ORDER — ENOXAPARIN SODIUM 40 MG/0.4ML ~~LOC~~ SOLN
40.0000 mg | SUBCUTANEOUS | Status: DC
  Administered 2018-12-29 – 2018-12-31 (×3): 40 mg via SUBCUTANEOUS
  Filled 2018-12-29 (×3): qty 0.4

## 2018-12-29 MED ORDER — ZINC SULFATE 220 (50 ZN) MG PO CAPS
220.0000 mg | ORAL_CAPSULE | Freq: Every day | ORAL | Status: DC
  Administered 2018-12-29 – 2019-01-09 (×12): 220 mg via ORAL
  Filled 2018-12-29 (×12): qty 1

## 2018-12-29 MED ORDER — DOCUSATE SODIUM 100 MG PO CAPS
100.0000 mg | ORAL_CAPSULE | Freq: Every day | ORAL | Status: DC
  Administered 2018-12-29 – 2019-01-08 (×11): 100 mg via ORAL
  Filled 2018-12-29 (×11): qty 1

## 2018-12-29 MED ORDER — LEVOFLOXACIN IN D5W 750 MG/150ML IV SOLN
750.0000 mg | INTRAVENOUS | Status: DC
  Administered 2018-12-29 – 2018-12-31 (×2): 750 mg via INTRAVENOUS
  Filled 2018-12-29 (×2): qty 150

## 2018-12-29 MED ORDER — METOPROLOL SUCCINATE ER 25 MG PO TB24
50.0000 mg | ORAL_TABLET | Freq: Every day | ORAL | Status: DC
  Administered 2018-12-30 – 2019-01-06 (×7): 50 mg via ORAL
  Filled 2018-12-29 (×8): qty 2

## 2018-12-29 MED ORDER — IPRATROPIUM-ALBUTEROL 20-100 MCG/ACT IN AERS
1.0000 | INHALATION_SPRAY | Freq: Four times a day (QID) | RESPIRATORY_TRACT | Status: DC
  Filled 2018-12-29: qty 4

## 2018-12-29 MED ORDER — LEVOTHYROXINE SODIUM 50 MCG PO TABS
50.0000 ug | ORAL_TABLET | Freq: Every day | ORAL | Status: DC
  Administered 2018-12-30 – 2019-01-09 (×11): 50 ug via ORAL
  Filled 2018-12-29 (×11): qty 1

## 2018-12-29 MED ORDER — GUAIFENESIN-DM 100-10 MG/5ML PO SYRP: 10.0000 mL | ORAL_SOLUTION | ORAL | Status: DC | PRN

## 2018-12-29 MED ORDER — TRAMADOL HCL 50 MG PO TABS: 50.0000 mg | ORAL_TABLET | Freq: Four times a day (QID) | ORAL | Status: DC | PRN

## 2018-12-29 NOTE — Consult Note (Signed)
Pharmacy Antibiotic Note  Lance [name] is a 85 y.o. male admitted on 12/07/2018 with pneumonia.  Pharmacy has been consulted for levofloxacin dosing. He has a penicillin allergy (rash) and no confirmed history of exposure to cephalosporins in the past. His renal function is at what appears to be his baseline level.  Plan: Start levofloxacin 750 mg IV every 48 hours  Height: 5\' 9"  (175.3 cm) Weight: 137 lb 5.6 oz (62.3 kg) IBW/kg (Calculated) : 70.7  Temp (24hrs), Avg:98.8 F (37.1 C), Min:98.7 F (37.1 C), Max:98.8 F (37.1 C)  Recent Labs  Lab 12/19/2018 0505  WBC 8.7  CREATININE 0.95    Estimated Creatinine Clearance: 44.6 mL/min (by C-G formula based on SCr of 0.95 mg/dL).    Antimicrobials this admission: levofloxacin 12/26 >>   Microbiology results: No recent results  Thank you for allowing pharmacy to be a part of this patient's care.  Dallie Piles 01/03/2019 9:04 AM

## 2018-12-29 NOTE — H&P (Addendum)
TRH H&P   Patient Demographics:    Lance [name], is a 85 y.o. male  MRN: [id number]   DOB - [birth year]  Admit Date - 12/09/2018  Outpatient Primary MD for the patient is Ernestene Kiel, MD  Patient coming from: [facility]  No chief complaint on file.     HPI:    Lance [name]  is a 85 y.o. male, with prior CVA, CAD, HTN, myelodysplasia who presented as a transfer from [facility] after presenting with respiratory failure with hypoxia secondary to SARS-CoV-2.  At baseline patient lives at home alone, does all his ADLs with family living close by checking up on him.  Has had progressive shortness of breath over the past few days.  Admits to cough which is nonproductive.  Admits to generalized weakness, easy fatigability.  Denies any nausea, vomiting, diarrhea.  Denies any orthopnea, PND, edema in the ER he was found to be hypoxic with sats in the 80s on 5 LPM O2.  His CTA was negative for PE, interstitial pneumonitis consistent with a viral pneumonia.   Review of systems:  Review of Systems:  Constitutional: see HPI HEENT: negative for earaches, epistaxis, or sore throat Respiratory: see HPI Cardiovascular: negative for chest pain, palpitations, or syncope GU: negative for dysuria, urinary frequency, urinary urgency, hematuria Gastrointestinal: negative for abdominal pain, constipation, diarrhea, nausea or vomiting Musculoskeletal: negative for arthralgias, back pain or myalgias Neurological: negative for dizziness, headaches or weakness Behavioral/Psych: negative for suicidal or homicidal ideation Skin:negative for rash Heme: negative for bruises Endo: negative for hair loss, weight  gain/loss  With Past History of the following :   Past Medical History:  Diagnosis Date  . Atrial fibrillation ([place])   . Chronic pain syndrome 10/20/2017  . Coronary artery disease   . Coronary artery disease involving native heart 04/14/2015  . Degenerative disc disease, cervical 10/20/2017  . Essential hypertension 04/14/2015  . Facet arthropathy, cervical 10/20/2017  . Hyperlipidemia 04/14/2015  . Long term current use of opiate analgesic 10/20/2017  . Myelodysplasia   . PAF (paroxysmal atrial fibrillation) ([place]) 04/14/2015  . Pancytopenia ([place])   . 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 CARDIAC CATHETERIZATION  03/2007  . CARDIAC CATHETERIZATION  01/1997  . CATARACT EXTRACTION    .  CHOLECYSTECTOMY    . HIP SURGERY    . KNEE SURGERY      Social History:    Social History   Tobacco Use  . Smoking status: Never Smoker  . Smokeless tobacco: Never Used  Substance Use Topics  . Alcohol use: Not Currently    Alcohol/week: 0.0 standard drinks     Family History :    Family History  Problem Relation Age of Onset  . Coronary artery disease Mother   . Hypertension Mother   . Dementia Mother   . Coronary artery disease Father   . Diabetes Father   . Hypertension Father   . Coronary artery disease Brother     Home Medications:   Prior to Admission medications   Medication Sig Start Date End Date Taking? Authorizing Provider  ALPRAZolam Duanne Moron) 0.25 MG tablet Take 1 tablet by mouth as needed.    [provider]  aspirin EC 81 MG tablet Take 1 tablet by mouth daily.    [provider]  atorvastatin (LIPITOR) 10 MG tablet TAKE 1 TABLET BY MOUTH ONCE DAILY FOR CHOLESTEROL. 09/18/17   [provider]  gabapentin (NEURONTIN) 100 MG capsule Take 1 capsule by mouth as needed. 02/06/12   [provider]  levothyroxine (SYNTHROID, LEVOTHROID) 50 MCG tablet Take 1 tablet by  mouth daily. 12/20/15   [provider]  metoprolol succinate (TOPROL-XL) 50 MG 24 hr tablet Take 1 tablet by mouth daily. 02/23/15   [provider]  nitroGLYCERIN (NITROSTAT) 0.4 MG SL tablet Place 0.4 mg under the tongue every 5 (five) minutes as needed.  10/13/17   [provider]  PARoxetine (PAXIL) 20 MG tablet Take 20 mg by mouth every morning. 10/19/17   [provider]  ranitidine (ZANTAC) 300 MG tablet Take 1 tablet by mouth daily. 01/08/16   [provider]  traMADol (ULTRAM) 50 MG tablet Take 1 tablet by mouth as needed.    [provider]    Allergies:    Allergies  Allergen Reactions  . Morphine And Related Nausea And Vomiting  . Penicillins Rash    Other reaction(s): Unknown    Physical Exam:   Vitals  Blood pressure (P) 132/72, pulse (P) 70, temperature (P) 98.7 F (37.1 C), temperature source (P) Axillary, SpO2 (P) 95 %.  Physical Exam   Constitutional - resting comfortably, no acute distress Eyes - pupils equal round and reactive to light and accomodation, extra ocular movements intact Nose - no gross deformity or drainage Mouth - no oral lesions noted Throat - no swelling or erythema Neck - supple, no JVD   CV - (+)S1S2, no murmurs  Resp -diffuse rales and rhonchi,  GI - (+)BS, soft, non-tender, non-distended Extrem - no clubbing, cyanosis, or peripheral edema  Skin - no rashes or wounds Neuro - alert, aware, oriented to person/place/time  Psych - normal affect, no anxiety   Patient has Pressure Ulcer on Admission?: no   Data Review:    CBC Recent Labs  Lab 12/19/2018 0505  WBC 8.7  HGB 11.5*  HCT 33.7*  PLT 434*  MCV 93.9  MCH 32.0  MCHC 34.1  RDW 13.7  LYMPHSABS 0.7  MONOABS 0.4  EOSABS 0.0  BASOSABS 0.0   ------------------------------------------------------------------------------------------------------------------  Chemistries  Recent Labs  Lab 12/18/2018 0505  NA 141  K 4.1  CL  109  CO2 20*  GLUCOSE 106*  BUN 24*  CREATININE 0.95  CALCIUM 7.8*  AST 37  ALT 17  ALKPHOS 67  BILITOT 0.8   ------------------------------------------------------------------------------------------------------------------ estimated creatinine clearance is 44.6 mL/min (by C-G formula based on SCr of 0.95 mg/dL). ------------------------------------------------------------------------------------------------------------------ No results for input(s): TSH, T4TOTAL, T3FREE, THYROIDAB in the last 72 hours.  Invalid input(s): FREET3  Coagulation profile No results for input(s): INR, PROTIME in the last 168 hours. ------------------------------------------------------------------------------------------------------------------- No results for input(s): DDIMER in the last 72 hours. -------------------------------------------------------------------------------------------------------------------  Cardiac Enzymes No results for input(s): CKMB, TROPONINI, MYOGLOBIN in the last 168 hours.  Invalid input(s): CK ------------------------------------------------------------------------------------------------------------------ No results found for: BNP   ---------------------------------------------------------------------------------------------------------------  Urinalysis No results found for: COLORURINE, APPEARANCEUR, LABSPEC, [employer], GLUCOSEU, HGBUR, BILIRUBINUR, KETONESUR, PROTEINUR, UROBILINOGEN, NITRITE, LEUKOCYTESUR  ----------------------------------------------------------------------------------------------------------------   Imaging Results:    No results found.   Assessment & Plan:    Principal Problem:   Acute hypoxemic respiratory failure due to severe acute respiratory syndrome coronavirus 2 (SARS-CoV-2) disease (HCC) Active Problems:   Coronary artery disease involving native heart   Essential hypertension   Hyperlipidemia   PAF (paroxysmal atrial  fibrillation) (HCC)   Myelodysplasia (myelodysplastic syndrome) (HCC)     Acute hypoxemic respiratory failure due to SARS-CoV-2 disease: Patient presented with worsening shortness of breath, lethargy, easy fatigability.  Sats in the mid 80s on 5 LPM [place] O2, now on 6LPM  O2.  Continue remdesivir, Decadron, supplemental oxygen and wean as tolerated.    Coronary artery disease involving native heart: Denies any chest pain, resume home medications once verified.    Essential hypertension: Blood pressure is at goal.  Resume home medications when verified.    Hyperlipidemia: Cardiac diet.    PAF: Currently in sinus rhythm.  Resume home meds when verified.      Myelodysplasia: Mild anemia otherwise unremarkable CBC.  Continue to monitor.  DVT Prophylaxis  Lovenox   AM Labs Ordered, also please review Full Orders  Family Communication: Admission, patients condition and plan of care including tests being ordered have been discussed with the patient who indicate understanding and agree with the plan and Code Status.  Code Status Full  Likely DC to  Home  Condition GUARDED    Consults called: None    Admission status: Admit to inpatient  Time spent in minutes : 86   Peyton Bottoms M.D on 12/18/2018 at 3:47 AM  To page go to [email or web address] - password [facility]

## 2018-12-29 NOTE — Progress Notes (Signed)
PROGRESS NOTE  Lance [name] [id number] DOB: 06-10-1925 DOA: 12/18/2018 PCP: Ernestene Kiel, MD  HPI/Recap of past 78 hours: 85 year old male with past medical history of CAD and previous CVA who lives by himself presented to [facility] on 12/25 with progressively worsening shortness of breath times several days and was found to be hypoxic as well as Covid positive.  He required 6 L of oxygen.  At time of admission, labs noteworthy for proBNP of 1880, elevated procalcitonin level of 0.31 and normal lactic acid level.  Patient was started on remdesivir, IV steroids and given a dose of Actemra.  He was transferred to the [facility] on 12/26 early morning.  Today, patient is doing okay with no complaints.  Feels like breathing is steady and denies any pain.  Assessment/Plan: Principal Problem:   Acute hypoxemic respiratory failure due to severe acute respiratory syndrome coronavirus 2 (SARS-CoV-2) disease (HCC)/community-acquired pneumonia.: Received Actemra in the emergency room at [facility].  Continue IV steroids and remdesivir.  Continue to monitor oxygenation and follow his blood work.  IV Levaquin (given penicillin allergy) for pneumonia, given elevated procalcitonin level Active Problems:   Coronary artery disease involving native heart    Essential hypertension: Continue home meds    Hyperlipidemia    PAF (paroxysmal atrial fibrillation) ([place]): Currently normal sinus rhythm.    Myelodysplasia (myelodysplastic syndrome) (HCC)  Hypothyroidism: Continue Synthroid  Elevated BNP: Mildly elevated at 215 here.  Follow daily weights and may end up needing Lasix.  History of CVA  Code Status: Full code  Family Communication: Updated daughter by phone  Disposition Plan: Patient does have advanced age and significant comorbidities, but otherwise looks currently comfortable.  Hopefully he will have a good  recovery   Consultants:  None  Procedures:  None  Antimicrobials:  IV Levaquin 12/25-present  IV Remdisivir 12/25-present  IV Actemra x1 dose 12/25  DVT prophylaxis: Lovenox   Objective: Vitals:   12/10/2018 0200 12/27/2018 0405  BP: 113/64 127/76  Pulse: 62 (!) 59  Resp:    Temp:  98.8 F (37.1 C)  SpO2:  94%    Intake/Output Summary (Last 24 hours) at 12/30/2018 1524 Last data filed at 12/12/2018 1500 Gross per 24 hour  Intake 220 ml  Output --  Net 220 ml   Filed Weights   01/02/2019 0533  Weight: 62.3 kg   Body mass index is 20.28 kg/m.  Exam:   General: Alert and oriented x3, no acute distress  HEENT: Normocephalic and atraumatic, mucous membranes slightly dry  Neck: Supple, no JVD  Cardiovascular: Currently regular rate and rhythm, S1-S2  Respiratory: Few scattered rails  Abdomen: Soft, nontender, nondistended, positive bowel sounds  Musculoskeletal: No clubbing or cyanosis or edema  Skin: No skin breaks, tears or lesions  Psychiatry: Appropriate, no evidence of psychoses   Data Reviewed: CBC: Recent Labs  Lab 12/13/2018 0505  WBC 8.7  NEUTROABS 7.5  HGB 11.5*  HCT 33.7*  MCV 93.9  PLT [id number]*   Basic Metabolic Panel: Recent Labs  Lab 12/20/2018 0505  NA 141  K 4.1  CL 109  CO2 20*  GLUCOSE 106*  BUN 24*  CREATININE 0.95  CALCIUM 7.8*   GFR: Estimated Creatinine Clearance: 44.6 mL/min (by C-G formula based on SCr of 0.95 mg/dL). Liver Function Tests: Recent Labs  Lab 12/09/2018 0505  AST 37  ALT 17  ALKPHOS 67  BILITOT 0.8  PROT 6.1*  ALBUMIN 2.1*   No results for input(s): LIPASE,  AMYLASE in the last 168 hours. No results for input(s): AMMONIA in the last 168 hours. Coagulation Profile: No results for input(s): INR, PROTIME in the last 168 hours. Cardiac Enzymes: No results for input(s): CKTOTAL, CKMB, CKMBINDEX, TROPONINI in the last 168 hours. BNP (last 3 results) No results for input(s): PROBNP in the last  8760 hours. HbA1C: No results for input(s): HGBA1C in the last 72 hours. CBG: No results for input(s): GLUCAP in the last 168 hours. Lipid Profile: No results for input(s): CHOL, HDL, LDLCALC, TRIG, CHOLHDL, LDLDIRECT in the last 72 hours. Thyroid Function Tests: No results for input(s): TSH, T4TOTAL, FREET4, T3FREE, THYROIDAB in the last 72 hours. Anemia Panel: Recent Labs    12/16/2018 0505  FERRITIN 385*   Urine analysis: No results found for: COLORURINE, APPEARANCEUR, LABSPEC, PHURINE, GLUCOSEU, HGBUR, BILIRUBINUR, KETONESUR, PROTEINUR, UROBILINOGEN, NITRITE, LEUKOCYTESUR Sepsis Labs: @LABRCNTIP (procalcitonin:4,lacticidven:4)  )No results found for this or any previous visit (from the past 240 hour(s)).    Studies: DG CHEST PORT 1 VIEW  Result Date: 12/28/2018 CLINICAL DATA:  Acute hypoxic respiratory failure due to COVID-19. EXAM: PORTABLE CHEST 1 VIEW COMPARISON:  12/27/2018 FINDINGS: Lungs are hypoinflated as lordotic technique is demonstrated. Exam demonstrates persistent hazy patchy bilateral airspace process over the mid to lower lungs likely multifocal pneumonia in this COVID-19 patient. No effusion. Mild stable cardiomegaly. Remainder of the exam is unchanged. IMPRESSION: Stable hazy bilateral airspace process compatible with multifocal pneumonia in this COVID-19 positive patient. Stable cardiomegaly. Electronically Signed   By: Marin Olp M.D.   On: 12/23/2018 10:22    Scheduled Meds: . vitamin C  500 mg Oral Daily  . dexamethasone  6 mg Oral Q24H  . docusate sodium  100 mg Oral Daily  . enoxaparin (LOVENOX) injection  40 mg Subcutaneous Q24H  . Ipratropium-Albuterol  1 puff Inhalation Q6H  . zinc sulfate  220 mg Oral Daily    Continuous Infusions: . levofloxacin (LEVAQUIN) IV 750 mg (12/17/2018 1042)  . remdesivir 100 mg in NS 100 mL 100 mg (12/20/2018 0916)     LOS: 0 days     Annita Brod, MD [facility]  To reach me or the doctor on call,  go to: [email or web address] Password [email or web address]  12/28/2018, 3:24 PM

## 2018-12-29 NOTE — Progress Notes (Signed)
Pt transferred from [place] - spoke briefly with pharmacist at [facility]  Pt received: Remdesivir 200mg  12/24 and 100mg  12/25 1816 Actemra 720mg  12/25 2100 Decadron 6mg  IV 12/25 1000 Lovenox 40mg  SQ 12/25 1800  ALT 14 SCr 0.8  Plan: Remdesivir 100mg  IV q24h x 3 doses to complete 5-day course Will f/u ALT  Sherlon Handing, PharmD, BCPS Please see amion for complete clinical pharmacist phone list 12/05/2018 4:01 AM

## 2018-12-29 NOTE — Progress Notes (Signed)
Patient arrived to unit via EMS

## 2018-12-29 NOTE — Plan of Care (Signed)
Spoke with pt daughter [name] and provider her with an update.

## 2018-12-30 DIAGNOSIS — E039 Hypothyroidism, unspecified: Secondary | ICD-10-CM

## 2018-12-30 DIAGNOSIS — R7989 Other specified abnormal findings of blood chemistry: Secondary | ICD-10-CM

## 2018-12-30 LAB — CBC WITH DIFFERENTIAL/PLATELET
Abs Immature Granulocytes: 0.04 10*3/uL (ref 0.00–0.07)
Basophils Absolute: 0 10*3/uL (ref 0.0–0.1)
Basophils Relative: 0 %
Eosinophils Absolute: 0 10*3/uL (ref 0.0–0.5)
Eosinophils Relative: 0 %
HCT: 33.9 % — ABNORMAL LOW (ref 39.0–52.0)
Hemoglobin: 11.4 g/dL — ABNORMAL LOW (ref 13.0–17.0)
Immature Granulocytes: 1 %
Lymphocytes Relative: 7 %
Lymphs Abs: 0.5 10*3/uL — ABNORMAL LOW (ref 0.7–4.0)
MCH: 32.4 pg (ref 26.0–34.0)
MCHC: 33.6 g/dL (ref 30.0–36.0)
MCV: 96.3 fL (ref 80.0–100.0)
Monocytes Absolute: 0.5 10*3/uL (ref 0.1–1.0)
Monocytes Relative: 7 %
Neutro Abs: 6.3 10*3/uL (ref 1.7–7.7)
Neutrophils Relative %: 85 %
Platelets: 418 10*3/uL — ABNORMAL HIGH (ref 150–400)
RBC: 3.52 MIL/uL — ABNORMAL LOW (ref 4.22–5.81)
RDW: 14.2 % (ref 11.5–15.5)
WBC: 7.3 10*3/uL (ref 4.0–10.5)
nRBC: 0 % (ref 0.0–0.2)

## 2018-12-30 LAB — COMPREHENSIVE METABOLIC PANEL
ALT: 21 U/L (ref 0–44)
AST: 47 U/L — ABNORMAL HIGH (ref 15–41)
Albumin: 2.1 g/dL — ABNORMAL LOW (ref 3.5–5.0)
Alkaline Phosphatase: 59 U/L (ref 38–126)
Anion gap: 8 (ref 5–15)
BUN: 25 mg/dL — ABNORMAL HIGH (ref 8–23)
CO2: 23 mmol/L (ref 22–32)
Calcium: 8 mg/dL — ABNORMAL LOW (ref 8.9–10.3)
Chloride: 110 mmol/L (ref 98–111)
Creatinine, Ser: 0.99 mg/dL (ref 0.61–1.24)
GFR calc Af Amer: >60 mL/min (ref >60)
GFR calc non Af Amer: >60 mL/min (ref >60)
Glucose, Bld: 112 mg/dL — ABNORMAL HIGH (ref 70–99)
Potassium: 4 mmol/L (ref 3.5–5.1)
Sodium: 141 mmol/L (ref 135–145)
Total Bilirubin: 0.7 mg/dL (ref 0.3–1.2)
Total Protein: 5.7 g/dL — ABNORMAL LOW (ref 6.5–8.1)

## 2018-12-30 LAB — FERRITIN: Ferritin: 296 ng/mL (ref 24–336)

## 2018-12-30 LAB — PROCALCITONIN: Procalcitonin: 0.15 ng/mL

## 2018-12-30 LAB — C-REACTIVE PROTEIN: CRP: 7.8 mg/dL — ABNORMAL HIGH (ref <1.0)

## 2018-12-30 LAB — D-DIMER, QUANTITATIVE: D-Dimer, Quant: 3.03 ug/mL-FEU — ABNORMAL HIGH (ref 0.00–0.50)

## 2018-12-30 NOTE — Plan of Care (Signed)
  Problem: Respiratory: Goal: Will maintain a patent airway Outcome: Progressing   Problem: Education: Goal: Knowledge of risk factors and measures for prevention of condition will improve Outcome: Not Progressing

## 2018-12-30 NOTE — Progress Notes (Signed)
PROGRESS NOTE  Lance [name] [id number] DOB: 1925/04/13 DOA: 12/28/2018 PCP: Ernestene Kiel, MD  HPI/Recap of past 4 hours: 85 year old male with past medical history of CAD and previous CVA who lives by himself presented to [facility] on 12/25 with progressively worsening shortness of breath times several days and was found to be hypoxic as well as Covid positive.  He required 6 L of oxygen.  At time of admission, labs noteworthy for proBNP of 1880, elevated procalcitonin level of 0.31 and normal lactic acid level.  Patient was started on remdesivir, IV steroids and given a dose of Actemra.  He was transferred to the [facility] on 12/26 early morning.  Patient doing okay.  No events overnight.  He is actually down to 4 L nasal cannula.  He says he feels about the same.  Ferritin, CRP and procalcitonin levels all trending downward.  Assessment/Plan: Principal Problem:   Acute hypoxemic respiratory failure due to severe acute respiratory syndrome coronavirus 2 (SARS-CoV-2) disease (HCC)/community-acquired pneumonia.: Received Actemra in the emergency room at [facility].  Continued on IV steroids and remdesivir.  Continue to monitor oxygenation and follow his blood work.  IV Levaquin (given penicillin allergy) for pneumonia, given elevated procalcitonin level.  Patient continues to do well, needing less oxygen today. Active Problems:   Coronary artery disease involving native heart: Stable at this time    Essential hypertension: Continue home meds    Hyperlipidemia    PAF (paroxysmal atrial fibrillation) ([place]): Currently normal sinus rhythm.    Myelodysplasia (myelodysplastic syndrome) (HCC)  Hypothyroidism: Continue Synthroid  Elevated BNP: Mildly elevated at 215 here.  Follow daily weights and may end up needing Lasix.  Recheck BMP in the morning  History of CVA  Code Status: Full code  Family Communication: Updated daughter by  phone  Disposition Plan: Patient does have advanced age and significant comorbidities, but otherwise looks currently comfortable.  Hopefully he will have a good recovery   Consultants:  None  Procedures:  None  Antimicrobials:  IV Levaquin 12/25-present  IV Remdisivir 12/25-present  IV Actemra x1 dose 12/25  DVT prophylaxis: Lovenox   Objective: Vitals:   12/30/18 1200 12/30/18 1600  BP: 119/64 116/66  Pulse: 76 60  Resp:    Temp:  97.7 F (36.5 C)  SpO2: 94% 100%    Intake/Output Summary (Last 24 hours) at 12/30/2018 1633 Last data filed at 12/30/2018 1300 Gross per 24 hour  Intake 360 ml  Output 150 ml  Net 210 ml   Filed Weights   12/24/2018 0533  Weight: 62.3 kg   Body mass index is 20.28 kg/m.  Exam:   General: Alert and oriented x3, no acute distress  HEENT: Normocephalic and atraumatic, mucous membranes slightly dry  Neck: Supple, no JVD  Cardiovascular: Currently regular rate and rhythm, S1-S2  Respiratory: Decreased breath sounds throughout  Abdomen: Soft, nontender, nondistended, positive bowel sounds  Musculoskeletal: No clubbing or cyanosis or edema  Skin: No skin breaks, tears or lesions  Psychiatry: Appropriate, no evidence of psychoses   Data Reviewed: CBC: Recent Labs  Lab 12/17/2018 0505 12/30/18 0300  WBC 8.7 7.3  NEUTROABS 7.5 6.3  HGB 11.5* 11.4*  HCT 33.7* 33.9*  MCV 93.9 96.3  PLT 434* [id number]*   Basic Metabolic Panel: Recent Labs  Lab 12/05/2018 0505 12/30/18 0300  NA 141 141  K 4.1 4.0  CL 109 110  CO2 20* 23  GLUCOSE 106* 112*  BUN 24* 25*  CREATININE 0.95  0.99  CALCIUM 7.8* 8.0*   GFR: Estimated Creatinine Clearance: 42.8 mL/min (by C-G formula based on SCr of 0.99 mg/dL). Liver Function Tests: Recent Labs  Lab 12/28/2018 0505 12/30/18 0300  AST 37 47*  ALT 17 21  ALKPHOS 67 59  BILITOT 0.8 0.7  PROT 6.1* 5.7*  ALBUMIN 2.1* 2.1*   No results for input(s): LIPASE, AMYLASE in the last 168  hours. No results for input(s): AMMONIA in the last 168 hours. Coagulation Profile: No results for input(s): INR, PROTIME in the last 168 hours. Cardiac Enzymes: No results for input(s): CKTOTAL, CKMB, CKMBINDEX, TROPONINI in the last 168 hours. BNP (last 3 results) No results for input(s): PROBNP in the last 8760 hours. HbA1C: No results for input(s): HGBA1C in the last 72 hours. CBG: No results for input(s): GLUCAP in the last 168 hours. Lipid Profile: No results for input(s): CHOL, HDL, LDLCALC, TRIG, CHOLHDL, LDLDIRECT in the last 72 hours. Thyroid Function Tests: No results for input(s): TSH, T4TOTAL, FREET4, T3FREE, THYROIDAB in the last 72 hours. Anemia Panel: Recent Labs    01/03/2019 0505 12/30/18 0300  FERRITIN 385* 296   Urine analysis: No results found for: COLORURINE, APPEARANCEUR, LABSPEC, PHURINE, GLUCOSEU, HGBUR, BILIRUBINUR, KETONESUR, PROTEINUR, UROBILINOGEN, NITRITE, LEUKOCYTESUR Sepsis Labs: @LABRCNTIP (procalcitonin:4,lacticidven:4)  )No results found for this or any previous visit (from the past 240 hour(s)).    Studies: No results found.  Scheduled Meds: . vitamin C  500 mg Oral Daily  . aspirin EC  81 mg Oral Daily  . dexamethasone  6 mg Oral Q24H  . docusate sodium  100 mg Oral Daily  . enoxaparin (LOVENOX) injection  40 mg Subcutaneous Q24H  . famotidine  10 mg Oral Daily  . Ipratropium-Albuterol  1 puff Inhalation Q6H  . levothyroxine  50 mcg Oral QAC breakfast  . metoprolol succinate  50 mg Oral Daily  . PARoxetine  20 mg Oral q morning - 10a  . zinc sulfate  220 mg Oral Daily    Continuous Infusions: . levofloxacin (LEVAQUIN) IV 750 mg (12/28/2018 1042)  . remdesivir 100 mg in NS 100 mL 100 mg (12/30/18 0905)     LOS: 1 day     Annita Brod, MD [facility]  To reach me or the doctor on call, go to: [email or web address] Password [email or web address]  12/30/2018, 4:33 PM

## 2018-12-31 LAB — CBC WITH DIFFERENTIAL/PLATELET
Abs Immature Granulocytes: 0.03 10*3/uL (ref 0.00–0.07)
Basophils Absolute: 0 10*3/uL (ref 0.0–0.1)
Basophils Relative: 0 %
Eosinophils Absolute: 0.1 10*3/uL (ref 0.0–0.5)
Eosinophils Relative: 2 %
HCT: 36.8 % — ABNORMAL LOW (ref 39.0–52.0)
Hemoglobin: 12.1 g/dL — ABNORMAL LOW (ref 13.0–17.0)
Immature Granulocytes: 0 %
Lymphocytes Relative: 9 %
Lymphs Abs: 0.6 10*3/uL — ABNORMAL LOW (ref 0.7–4.0)
MCH: 31.8 pg (ref 26.0–34.0)
MCHC: 32.9 g/dL (ref 30.0–36.0)
MCV: 96.6 fL (ref 80.0–100.0)
Monocytes Absolute: 0.3 10*3/uL (ref 0.1–1.0)
Monocytes Relative: 4 %
Neutro Abs: 5.7 10*3/uL (ref 1.7–7.7)
Neutrophils Relative %: 85 %
Platelets: 394 10*3/uL (ref 150–400)
RBC: 3.81 MIL/uL — ABNORMAL LOW (ref 4.22–5.81)
RDW: 14.3 % (ref 11.5–15.5)
WBC: 6.8 10*3/uL (ref 4.0–10.5)
nRBC: 0 % (ref 0.0–0.2)

## 2018-12-31 LAB — COMPREHENSIVE METABOLIC PANEL
ALT: 25 U/L (ref 0–44)
AST: 58 U/L — ABNORMAL HIGH (ref 15–41)
Albumin: 2.3 g/dL — ABNORMAL LOW (ref 3.5–5.0)
Alkaline Phosphatase: 64 U/L (ref 38–126)
Anion gap: 8 (ref 5–15)
BUN: 20 mg/dL (ref 8–23)
CO2: 23 mmol/L (ref 22–32)
Calcium: 8 mg/dL — ABNORMAL LOW (ref 8.9–10.3)
Chloride: 110 mmol/L (ref 98–111)
Creatinine, Ser: 0.79 mg/dL (ref 0.61–1.24)
GFR calc Af Amer: >60 mL/min (ref >60)
GFR calc non Af Amer: >60 mL/min (ref >60)
Glucose, Bld: 70 mg/dL (ref 70–99)
Potassium: 4.4 mmol/L (ref 3.5–5.1)
Sodium: 141 mmol/L (ref 135–145)
Total Bilirubin: 1 mg/dL (ref 0.3–1.2)
Total Protein: 5.7 g/dL — ABNORMAL LOW (ref 6.5–8.1)

## 2018-12-31 LAB — FERRITIN: Ferritin: 281 ng/mL (ref 24–336)

## 2018-12-31 LAB — BRAIN NATRIURETIC PEPTIDE: B Natriuretic Peptide: 122 pg/mL — ABNORMAL HIGH (ref 0.0–100.0)

## 2018-12-31 LAB — D-DIMER, QUANTITATIVE: D-Dimer, Quant: 3.49 ug/mL-FEU — ABNORMAL HIGH (ref 0.00–0.50)

## 2018-12-31 LAB — C-REACTIVE PROTEIN: CRP: 4.7 mg/dL — ABNORMAL HIGH (ref <1.0)

## 2018-12-31 MED ORDER — ALBUTEROL SULFATE HFA 108 (90 BASE) MCG/ACT IN AERS
1.0000 | INHALATION_SPRAY | RESPIRATORY_TRACT | Status: DC | PRN
  Filled 2018-12-31: qty 6.7

## 2018-12-31 MED ORDER — ATORVASTATIN CALCIUM 10 MG PO TABS
10.0000 mg | ORAL_TABLET | Freq: Every day | ORAL | Status: DC
  Administered 2018-12-31 – 2019-01-07 (×8): 10 mg via ORAL
  Filled 2018-12-31 (×8): qty 1

## 2018-12-31 MED ORDER — GABAPENTIN 100 MG PO CAPS
100.0000 mg | ORAL_CAPSULE | Freq: Every day | ORAL | Status: DC
  Administered 2018-12-31 – 2019-01-08 (×9): 100 mg via ORAL
  Filled 2018-12-31 (×9): qty 1

## 2018-12-31 NOTE — Progress Notes (Addendum)
1900 Report from [facility].   2010 Shift assessment and medications given, VS WDL. Pt on 3 L [place]. 02 95%. Condom cath in place. Bed alarmed. Floor matts at bedside for fall precaution measures. Call bell in reach.   2030 Call from [facility], daughter, updated her on plan of care.  2300 Rounds on pt, VS WDL  0100 Rounds on pt, pt asleep in bed.  0300 Rounds on pt, pt asleep in bed.  0400 Rounds on pt, VS WDL.  0500 Pt washed up, new linens applied. Condom cath in place.  0720 Report given to oncoming RN

## 2018-12-31 NOTE — Progress Notes (Addendum)
Lance [name]  [id number] DOB: [birth year] DOA: 12/12/2018 PCP: Ernestene Kiel, MD    Brief Narrative:  85 year old with a history of CAD, CVA, HLD, paroxysmal atrial fibrillation, MDS, and hypothyroidism who lives independently and presented to [facility] 12/25 with shortness of breath for several days.    Significant Events: 12/26 admit to [facility] via [place] ED  COVID-19 specific Treatment: Decadron 12/25 > Remdesivir 12/25 >  Antimicrobials:  Levaquin 12/26 >  Subjective: Now only requiring 1 L nasal cannula support.  In no apparent acute distress.  No uncontrolled pain.  Assessment & Plan:  COVID Pneumonia -acute hypoxic respiratory failure Continue Decadron and remdesivir -clinically improving  Recent Labs  Lab 12/20/2018 0505 12/30/18 0300 12/31/18 0340  DDIMER  --  3.03* 3.49*  FERRITIN 385* 296 281  CRP 13.6* 7.8* 4.7*  ALT 17 21 25   PROCALCITON 0.25 0.15  --     CAD No acute issues  HTN Blood pressure mildly low - follow without change today  HLD Continue home medical therapy  Paroxysmal atrial fibrillation NSR at time of exam today  Myelodysplastic syndrome  Hypothyroidism Continue home medical therapy  History of CVA  DVT prophylaxis: Lovenox Code Status: FULL CODE Family Communication:  Disposition Plan: Telemetry bed  Consultants:  none  Objective: Blood pressure 124/67, pulse 69, temperature 98.1 F (36.7 C), resp. rate (!) 25, height 5\' 9"  (1.753 m), weight 62.3 kg, SpO2 98 %.  Intake/Output Summary (Last 24 hours) at 12/31/2018 0846 Last data filed at 12/30/2018 2000 Gross per 24 hour  Intake 360 ml  Output 150 ml  Net 210 ml   Filed Weights   12/20/2018 0533  Weight: 62.3 kg    Examination: General: No acute respiratory distress Lungs: Fine diffuse crackles Cardiovascular: Regular rate and rhythm without murmur gallop or rub normal S1 and S2 Abdomen: Nontender, nondistended, soft, bowel sounds  positive, no rebound, no ascites, no appreciable mass Extremities: No significant cyanosis, clubbing, or edema bilateral lower extremities  CBC: Recent Labs  Lab 12/14/2018 0505 12/30/18 0300 12/31/18 0340  WBC 8.7 7.3 6.8  NEUTROABS 7.5 6.3 5.7  HGB 11.5* 11.4* 12.1*  HCT 33.7* 33.9* 36.8*  MCV 93.9 96.3 96.6  PLT 434* 418* [id number]   Basic Metabolic Panel: Recent Labs  Lab 12/06/2018 0505 12/30/18 0300 12/31/18 0340  NA 141 141 141  K 4.1 4.0 4.4  CL 109 110 110  CO2 20* 23 23  GLUCOSE 106* 112* 70  BUN 24* 25* 20  CREATININE 0.95 0.99 0.79  CALCIUM 7.8* 8.0* 8.0*   GFR: Estimated Creatinine Clearance: 53 mL/min (by C-G formula based on SCr of 0.79 mg/dL).  Liver Function Tests: Recent Labs  Lab 12/16/2018 0505 12/30/18 0300 12/31/18 0340  AST 37 47* 58*  ALT 17 21 25   ALKPHOS 67 59 64  BILITOT 0.8 0.7 1.0  PROT 6.1* 5.7* 5.7*  ALBUMIN 2.1* 2.1* 2.3*     Scheduled Meds: . vitamin C  500 mg Oral Daily  . aspirin EC  81 mg Oral Daily  . dexamethasone  6 mg Oral Q24H  . docusate sodium  100 mg Oral Daily  . enoxaparin (LOVENOX) injection  40 mg Subcutaneous Q24H  . famotidine  10 mg Oral Daily  . Ipratropium-Albuterol  1 puff Inhalation Q6H  . levothyroxine  50 mcg Oral QAC breakfast  . metoprolol succinate  50 mg Oral Daily  . PARoxetine  20 mg Oral q morning - 10a  . zinc  sulfate  220 mg Oral Daily   Continuous Infusions: . levofloxacin (LEVAQUIN) IV 750 mg (12/07/2018 1042)  . remdesivir 100 mg in NS 100 mL Stopped (12/30/18 0935)     LOS: 2 days   Cherene Altes, MD [facility] Office  [phone] Pager - Text Page per Amion  If 7PM-7AM, please contact night-coverage per Amion 12/31/2018, 8:46 AM

## 2018-12-31 NOTE — Plan of Care (Signed)
Pt A&Ox4. VSS, SpO2 > 88% on RA. No c/o pain throughout shift. Utilizing condom cath to void.   OOBTC for most of shit, tolerated transition well w/ FWW.  Daughter [name] [phone]) updated via phone on plan of care. All questions answered.   Lance [name]    Problem: Education: Goal: Knowledge of risk factors and measures for prevention of condition will improve Outcome: Progressing   Problem: Coping: Goal: Psychosocial and spiritual needs will be supported Outcome: Progressing   Problem: Respiratory: Goal: Will maintain a patent airway Outcome: Progressing Goal: Complications related to the disease process, condition or treatment will be avoided or minimized Outcome: Progressing

## 2018-12-31 NOTE — Plan of Care (Signed)
  Problem: Education: Goal: Knowledge of risk factors and measures for prevention of condition will improve Outcome: Progressing   Problem: Coping: Goal: Psychosocial and spiritual needs will be supported Outcome: Progressing   

## 2019-01-01 LAB — CBC WITH DIFFERENTIAL/PLATELET
Abs Immature Granulocytes: 0.07 10*3/uL (ref 0.00–0.07)
Basophils Absolute: 0 10*3/uL (ref 0.0–0.1)
Basophils Relative: 0 %
Eosinophils Absolute: 0.4 10*3/uL (ref 0.0–0.5)
Eosinophils Relative: 6 %
HCT: 38.7 % — ABNORMAL LOW (ref 39.0–52.0)
Hemoglobin: 13 g/dL (ref 13.0–17.0)
Immature Granulocytes: 1 %
Lymphocytes Relative: 9 %
Lymphs Abs: 0.7 10*3/uL (ref 0.7–4.0)
MCH: 32 pg (ref 26.0–34.0)
MCHC: 33.6 g/dL (ref 30.0–36.0)
MCV: 95.3 fL (ref 80.0–100.0)
Monocytes Absolute: 0.3 10*3/uL (ref 0.1–1.0)
Monocytes Relative: 5 %
Neutro Abs: 5.7 10*3/uL (ref 1.7–7.7)
Neutrophils Relative %: 79 %
Platelets: 411 10*3/uL — ABNORMAL HIGH (ref 150–400)
RBC: 4.06 MIL/uL — ABNORMAL LOW (ref 4.22–5.81)
RDW: 13.9 % (ref 11.5–15.5)
WBC: 7.2 10*3/uL (ref 4.0–10.5)
nRBC: 0 % (ref 0.0–0.2)

## 2019-01-01 LAB — D-DIMER, QUANTITATIVE: D-Dimer, Quant: 9.36 ug/mL-FEU — ABNORMAL HIGH (ref 0.00–0.50)

## 2019-01-01 LAB — COMPREHENSIVE METABOLIC PANEL
ALT: 24 U/L (ref 0–44)
AST: 40 U/L (ref 15–41)
Albumin: 2.5 g/dL — ABNORMAL LOW (ref 3.5–5.0)
Alkaline Phosphatase: 66 U/L (ref 38–126)
Anion gap: 9 (ref 5–15)
BUN: 21 mg/dL (ref 8–23)
CO2: 23 mmol/L (ref 22–32)
Calcium: 7.9 mg/dL — ABNORMAL LOW (ref 8.9–10.3)
Chloride: 106 mmol/L (ref 98–111)
Creatinine, Ser: 0.89 mg/dL (ref 0.61–1.24)
GFR calc Af Amer: >60 mL/min (ref >60)
GFR calc non Af Amer: >60 mL/min (ref >60)
Glucose, Bld: 77 mg/dL (ref 70–99)
Potassium: 3.9 mmol/L (ref 3.5–5.1)
Sodium: 138 mmol/L (ref 135–145)
Total Bilirubin: 0.9 mg/dL (ref 0.3–1.2)
Total Protein: 5.6 g/dL — ABNORMAL LOW (ref 6.5–8.1)

## 2019-01-01 LAB — C-REACTIVE PROTEIN: CRP: 2.8 mg/dL — ABNORMAL HIGH (ref <1.0)

## 2019-01-01 LAB — FERRITIN: Ferritin: 271 ng/mL (ref 24–336)

## 2019-01-01 MED ORDER — ENOXAPARIN SODIUM 30 MG/0.3ML ~~LOC~~ SOLN
30.0000 mg | Freq: Two times a day (BID) | SUBCUTANEOUS | Status: DC
  Administered 2019-01-01 – 2019-01-02 (×2): 30 mg via SUBCUTANEOUS
  Filled 2019-01-01 (×2): qty 0.3

## 2019-01-01 MED ORDER — ENOXAPARIN SODIUM 30 MG/0.3ML ~~LOC~~ SOLN: 30.0000 mg | Freq: Two times a day (BID) | SUBCUTANEOUS | Status: DC

## 2019-01-01 NOTE — Progress Notes (Signed)
Lance [name]  [id number] DOB: [birth year] DOA: 12/20/2018 PCP: Ernestene Kiel, MD    Brief Narrative:  85 year old with a history of CAD, CVA, HLD, paroxysmal atrial fibrillation, MDS, and hypothyroidism who lives independently and presented to [facility] 12/25 with shortness of breath for several days.  Significant Events: 12/26 admit to [facility] via [place] ED  COVID-19 specific Treatment: Decadron 12/25 > Remdesivir 12/25 > 12/29  Antimicrobials:  Levaquin 12/26 >  Subjective: Resting comfortably.  Denies any complaints.  Is quite pleasant.  No respiratory distress.  Desaturated into the low 80s on 4 L nasal cannula when working with physical therapy.  Assessment & Plan:  COVID Pneumonia -acute hypoxic respiratory failure Continue Decadron - completed a course of remdesivir - clinically improving -continues to require significant oxygen support with exertion  Recent Labs  Lab 12/30/2018 0505 12/30/18 0300 12/31/18 0340 01/01/19 0510  DDIMER  --  3.03* 3.49* 9.36*  FERRITIN 385* 296 281 271  CRP 13.6* 7.8* 4.7* 2.8*  ALT 17 21 25 24   PROCALCITON 0.25 0.15  --   --     Elevated d-dimer  signif increased as of this morning -no clinical signs of PE/DVT -increase to intermediate dose anticoagulation -check venous duplex  CAD No acute issues  HTN Blood pressure controlled at this time  HLD Continue home medical therapy  Paroxysmal atrial fibrillation NSR at time of exam today - unclear why patient not on anticoag (?advanced age - lives alone)  Myelodysplastic syndrome Count stable on CBC  Hypothyroidism Continue home medical therapy  History of CVA  DVT prophylaxis: Lovenox Code Status: FULL CODE Family Communication:  Disposition Plan: Downgrade to medical bed  Consultants:  none  Objective: Blood pressure 118/65, pulse 62, temperature (!) 97.5 F (36.4 C), temperature source Oral, resp. rate 19, height 5\' 9"  (1.753 m),  weight 62.3 kg, SpO2 96 %.  Intake/Output Summary (Last 24 hours) at 01/01/2019 1000 Last data filed at 01/01/2019 0500 Gross per 24 hour  Intake --  Output 500 ml  Net -500 ml   Filed Weights   12/11/2018 0533  Weight: 62.3 kg    Examination: General: No acute respiratory distress Lungs: Clear to auscultation throughout Cardiovascular: RRR without murmur or rub Abdomen: NT/ND, soft, BS positive Extremities: No clubbing or edema bilateral lower extremities -no erythema  CBC: Recent Labs  Lab 12/30/18 0300 12/31/18 0340 01/01/19 0510  WBC 7.3 6.8 7.2  NEUTROABS 6.3 5.7 5.7  HGB 11.4* 12.1* 13.0  HCT 33.9* 36.8* 38.7*  MCV 96.3 96.6 95.3  PLT 418* 394 123456*   Basic Metabolic Panel: Recent Labs  Lab 12/30/18 0300 12/31/18 0340 01/01/19 0510  NA 141 141 138  K 4.0 4.4 3.9  CL 110 110 106  CO2 23 23 23   GLUCOSE 112* 70 77  BUN 25* 20 21  CREATININE 0.99 0.79 0.89  CALCIUM 8.0* 8.0* 7.9*   GFR: Estimated Creatinine Clearance: 47.6 mL/min (by C-G formula based on SCr of 0.89 mg/dL).  Liver Function Tests: Recent Labs  Lab 12/24/2018 0505 12/30/18 0300 12/31/18 0340 01/01/19 0510  AST 37 47* 58* 40  ALT 17 21 25 24   ALKPHOS 67 59 64 66  BILITOT 0.8 0.7 1.0 0.9  PROT 6.1* 5.7* 5.7* 5.6*  ALBUMIN 2.1* 2.1* 2.3* 2.5*     Scheduled Meds: . vitamin C  500 mg Oral Daily  . aspirin EC  81 mg Oral Daily  . atorvastatin  10 mg Oral q1800  .  dexamethasone  6 mg Oral Q24H  . docusate sodium  100 mg Oral Daily  . enoxaparin (LOVENOX) injection  40 mg Subcutaneous Q24H  . famotidine  10 mg Oral Daily  . gabapentin  100 mg Oral QHS  . Ipratropium-Albuterol  1 puff Inhalation Q6H  . levothyroxine  50 mcg Oral QAC breakfast  . metoprolol succinate  50 mg Oral Daily  . PARoxetine  20 mg Oral q morning - 10a  . zinc sulfate  220 mg Oral Daily      LOS: 3 days   Cherene Altes, MD [facility] Office  [phone] Pager - Text Page per Amion  If  7PM-7AM, please contact night-coverage per Amion 01/01/2019, 10:00 AM

## 2019-01-01 NOTE — Evaluation (Signed)
Physical Therapy Evaluation Patient Details Name: Lance [name] MRN: [id number] DOB: 04/18/1925 Today's Date: 01/01/2019   History of Present Illness  85 year old with a history of CAD, CVA, HLD, paroxysmal atrial fibrillation, MDS, and hypothyroidism who lives independently and presented to [facility] 12/25 with shortness of breath for several days.  Clinical Impression   Pt admitted with above diagnosis. PTA was living home alone but has family close by to assist as needed. Pt states was independent at walker level, he reports he had a fall but it was several years ago. Pt currently with functional limitations due to the deficits listed below (see PT Problem List). This am pt needs min a for bed mob, mod a for sit<>stand and stand pivot also ambulation with RW. Pt was on 4L/min via  and maintained sats in 90s at rest, with ambulation pt desaturated to low 80s, pt able to recover quickly with cues for pursed lip breathing. Pt will benefit from skilled PT to increase their independence and safety with mobility to allow discharge to the venue listed below.       Follow Up Recommendations Supervision for mobility/OOB    Equipment Recommendations  None recommended by PT    Recommendations for Other Services       Precautions / Restrictions Precautions Precautions: Fall Restrictions Weight Bearing Restrictions: No      Mobility  Bed Mobility Overal bed mobility: Needs Assistance Bed Mobility: Supine to Sit     Supine to sit: Min assist        Transfers Overall transfer level: Needs assistance Equipment used: Rolling walker (2 wheeled) Transfers: Sit to/from [facility] Sit to Stand: Mod assist Stand pivot transfers: Mod assist          Ambulation/Gait Ambulation/Gait assistance: Mod assist Gait Distance (Feet): 10 Feet Assistive device: Rolling walker (2 wheeled) Gait Pattern/deviations: Step-to pattern;Wide base of support Gait  velocity: decreased      Stairs            Wheelchair Mobility    Modified Rankin (Stroke Patients Only)       Balance Overall balance assessment: Needs assistance Sitting-balance support: Feet unsupported Sitting balance-Leahy Scale: Good     Standing balance support: During functional activity;Bilateral upper extremity supported Standing balance-Leahy Scale: Fair                               Pertinent Vitals/Pain Pain Assessment: Faces Pain Score: 0-No pain    Home Living Family/patient expects to be discharged to:: Private residence Living Arrangements: Alone Available Help at Discharge: Family Type of Home: House Home Access: Stairs to enter   Technical brewer of Steps: 2 Home Layout: One level Home Equipment: Environmental consultant - 2 wheels;Cane - single point;Wheelchair - manual      Prior Function Level of Independence: Independent with assistive device(s)               Hand Dominance   Dominant Hand: Right    Extremity/Trunk Assessment   Upper Extremity Assessment Upper Extremity Assessment: Generalized weakness    Lower Extremity Assessment Lower Extremity Assessment: Generalized weakness       Communication   Communication: No difficulties  Cognition Arousal/Alertness: Awake/alert Behavior During Therapy: WFL for tasks assessed/performed Overall Cognitive Status: Within Functional Limits for tasks assessed  General Comments      Exercises Other Exercises Other Exercises: flutter valve use   Assessment/Plan    PT Assessment Patient needs continued PT services  PT Problem List Decreased strength;Decreased activity tolerance;Decreased balance;Decreased mobility;Decreased coordination       PT Treatment Interventions Gait training;Stair training;Functional mobility training;Therapeutic activities;Therapeutic exercise;Balance training;Neuromuscular  re-education;Patient/family education    PT Goals (Current goals can be found in the Care Plan section)  Acute Rehab PT Goals Patient Stated Goal: to get better PT Goal Formulation: With patient Time For Goal Achievement: 01/15/19 Potential to Achieve Goals: Good    Frequency Min 3X/week   Barriers to discharge Other (comment) lives alone    Co-evaluation               AM-PAC PT "6 Clicks" Mobility  Outcome Measure Help needed turning from your back to your side while in a flat bed without using bedrails?: A Little Help needed moving from lying on your back to sitting on the side of a flat bed without using bedrails?: A Little Help needed moving to and from a bed to a chair (including a wheelchair)?: A Lot Help needed standing up from a chair using your arms (e.g., wheelchair or bedside chair)?: A Lot Help needed to walk in hospital room?: A Lot Help needed climbing 3-5 steps with a railing? : A Lot 6 Click Score: 14    End of Session Equipment Utilized During Treatment: Oxygen Activity Tolerance: Patient limited by lethargy Patient left: in chair;with call bell/phone within reach;with nursing/sitter in room   PT Visit Diagnosis: Other abnormalities of gait and mobility (R26.89);Muscle weakness (generalized) (M62.81)    Time: [id number] PT Time Calculation (min) (ACUTE ONLY): 27 min   Charges:   PT Evaluation $PT Eval Moderate Complexity: 1 Mod PT Treatments $Therapeutic Activity: 8-22 mins        Horald Chestnut, PT   Delford Field 01/01/2019, 12:25 PM

## 2019-01-01 NOTE — Progress Notes (Addendum)
1900 Report from [facility]. Pt repositioned  in bed. New condom cath applied.  2000 Call from [facility], updated on plan of care. All questions answered at this time.   2030 Shift assessment and medication pass. Pt on 2 L [place]. Resting in bed. Condom cath in place. Call bell in reach. Bed alarmed.  2200 Rounds on pt, pt resting in bed.  0000 Rounds on pt, VS WDL.  0200 Rounds on pt, pt asleep in bed  0400 Rounds on pt, VS WDL. Pt does not want to be washed up right now. AM medications given  0600 Rounds on pt, pt resting in bed.  0701 Report given to [facility]

## 2019-01-02 ENCOUNTER — Inpatient Hospital Stay (HOSPITAL_COMMUNITY): Payer: Medicare Other

## 2019-01-02 DIAGNOSIS — R791 Abnormal coagulation profile: Secondary | ICD-10-CM

## 2019-01-02 LAB — BASIC METABOLIC PANEL
Anion gap: 10 (ref 5–15)
BUN: 25 mg/dL — ABNORMAL HIGH (ref 8–23)
CO2: 23 mmol/L (ref 22–32)
Calcium: 8 mg/dL — ABNORMAL LOW (ref 8.9–10.3)
Chloride: 105 mmol/L (ref 98–111)
Creatinine, Ser: 0.87 mg/dL (ref 0.61–1.24)
GFR calc Af Amer: >60 mL/min (ref >60)
GFR calc non Af Amer: >60 mL/min (ref >60)
Glucose, Bld: 80 mg/dL (ref 70–99)
Potassium: 4.3 mmol/L (ref 3.5–5.1)
Sodium: 138 mmol/L (ref 135–145)

## 2019-01-02 LAB — CBC
HCT: 37.7 % — ABNORMAL LOW (ref 39.0–52.0)
Hemoglobin: 12.8 g/dL — ABNORMAL LOW (ref 13.0–17.0)
MCH: 32.1 pg (ref 26.0–34.0)
MCHC: 34 g/dL (ref 30.0–36.0)
MCV: 94.5 fL (ref 80.0–100.0)
Platelets: 361 10*3/uL (ref 150–400)
RBC: 3.99 MIL/uL — ABNORMAL LOW (ref 4.22–5.81)
RDW: 13.9 % (ref 11.5–15.5)
WBC: 7.7 10*3/uL (ref 4.0–10.5)
nRBC: 0 % (ref 0.0–0.2)

## 2019-01-02 LAB — D-DIMER, QUANTITATIVE: D-Dimer, Quant: 7.45 ug/mL-FEU — ABNORMAL HIGH (ref 0.00–0.50)

## 2019-01-02 MED ORDER — ENOXAPARIN SODIUM 60 MG/0.6ML ~~LOC~~ SOLN
1.0000 mg/kg | Freq: Two times a day (BID) | SUBCUTANEOUS | Status: DC
  Administered 2019-01-02 – 2019-01-03 (×2): 60 mg via SUBCUTANEOUS
  Filled 2019-01-02 (×2): qty 0.6

## 2019-01-02 NOTE — Progress Notes (Signed)
Lance [name]  [id number] DOB: 04-20-1925 DOA: 12/06/2018 PCP: Ernestene Kiel, MD    Brief Narrative:  85 year old with a history of CAD, CVA, HLD, paroxysmal atrial fibrillation, MDS, and hypothyroidism who lives independently and presented to [facility] 12/25 with shortness of breath for several days.  Significant Events: 12/26 admit to [facility] via [place] ED  COVID-19 specific Treatment: Decadron 12/25 > Remdesivir 12/25 > 12/29  Antimicrobials:  Levaquin 12/26 >  Subjective: Resting comfortably in bed.  Conversant.  Reports a poor appetite.  Denies shortness of breath or chest pain.  Assessment & Plan:  COVID Pneumonia -acute hypoxic respiratory failure Continue Decadron - completed a course of remdesivir - clinically improving -continues to require significant oxygen support with exertion -wean oxygen as able  Recent Labs  Lab 12/27/2018 0505 12/30/18 0300 12/31/18 0340 01/01/19 0510 01/02/19 0100  DDIMER  --  3.03* 3.49* 9.36* 7.45*  FERRITIN 385* 296 281 271  --   CRP 13.6* 7.8* 4.7* 2.8*  --   ALT 17 21 25 24   --   PROCALCITON 0.25 0.15  --   --   --     L popliteal DVT Confirmed on venous duplex - increase lovenox to full tx dose   CAD No acute issues  HTN Blood pressure controlled at this time  HLD Continue home medical therapy  Paroxysmal atrial fibrillation NSR at time of exam today - unclear why patient not on anticoag (?advanced age - lives alone)  Myelodysplastic syndrome Count stable on CBC  Hypothyroidism Continue home medical therapy  History of CVA  DVT prophylaxis: Lovenox Code Status: FULL CODE Family Communication:  Disposition Plan: medical bed  Consultants:  none  Objective: Blood pressure (!) 93/59, pulse 64, temperature 98.4 F (36.9 C), temperature source Oral, resp. rate 18, height 5\' 9"  (1.753 m), weight 62.3 kg, SpO2 90 %.  Intake/Output Summary (Last 24 hours) at 01/02/2019 0947 Last  data filed at 01/01/2019 1837 Gross per 24 hour  Intake 360 ml  Output 200 ml  Net 160 ml   Filed Weights   12/22/2018 0533  Weight: 62.3 kg    Examination: General: No acute respiratory distress Lungs: Clear to auscultation -no wheezing Cardiovascular: RRR without murmur or rub Abdomen: NT/ND, soft, BS positive Extremities: No significant edema bilateral lower extremities including left lower extremity with confirmed DVT -no calf tenderness or erythema  CBC: Recent Labs  Lab 12/30/18 0300 12/31/18 0340 01/01/19 0510 01/02/19 0100  WBC 7.3 6.8 7.2 7.7  NEUTROABS 6.3 5.7 5.7  --   HGB 11.4* 12.1* 13.0 12.8*  HCT 33.9* 36.8* 38.7* 37.7*  MCV 96.3 96.6 95.3 94.5  PLT 418* 394 411* [id number]   Basic Metabolic Panel: Recent Labs  Lab 12/31/18 0340 01/01/19 0510 01/02/19 0100  NA 141 138 138  K 4.4 3.9 4.3  CL 110 106 105  CO2 23 23 23   GLUCOSE 70 77 80  BUN 20 21 25*  CREATININE 0.79 0.89 0.87  CALCIUM 8.0* 7.9* 8.0*   GFR: Estimated Creatinine Clearance: 48.7 mL/min (by C-G formula based on SCr of 0.87 mg/dL).  Liver Function Tests: Recent Labs  Lab 12/04/2018 0505 12/30/18 0300 12/31/18 0340 01/01/19 0510  AST 37 47* 58* 40  ALT 17 21 25 24   ALKPHOS 67 59 64 66  BILITOT 0.8 0.7 1.0 0.9  PROT 6.1* 5.7* 5.7* 5.6*  ALBUMIN 2.1* 2.1* 2.3* 2.5*     Scheduled Meds: . vitamin C  500 mg Oral  Daily  . aspirin EC  81 mg Oral Daily  . atorvastatin  10 mg Oral q1800  . dexamethasone  6 mg Oral Q24H  . docusate sodium  100 mg Oral Daily  . enoxaparin (LOVENOX) injection  30 mg Subcutaneous Q12H  . famotidine  10 mg Oral Daily  . gabapentin  100 mg Oral QHS  . Ipratropium-Albuterol  1 puff Inhalation Q6H  . levothyroxine  50 mcg Oral QAC breakfast  . metoprolol succinate  50 mg Oral Daily  . PARoxetine  20 mg Oral q morning - 10a  . zinc sulfate  220 mg Oral Daily      LOS: 4 days   Cherene Altes, MD [facility] Office  [phone] Pager - Text  Page per Amion  If 7PM-7AM, please contact night-coverage per Amion 01/02/2019, 9:47 AM

## 2019-01-02 NOTE — Progress Notes (Addendum)
1900 Report from [facility].   1930 Shift assessment and vital signs. Pt on room air. Condom cath in place. Bed alarmed. Call bell in reach.   2130 PM mediations given  0500 AM medications given.

## 2019-01-02 NOTE — Progress Notes (Signed)
Lower extremity venous has been completed.   Preliminary results in CV Proc.   Abram Sander 01/02/2019 10:49 AM

## 2019-01-03 ENCOUNTER — Ambulatory Visit: Payer: Medicare Other | Admitting: Cardiology

## 2019-01-03 MED ORDER — RESOURCE THICKENUP CLEAR PO POWD
ORAL | Status: DC | PRN
  Filled 2019-01-03: qty 125

## 2019-01-03 MED ORDER — APIXABAN 5 MG PO TABS: 5.0000 mg | ORAL_TABLET | Freq: Two times a day (BID) | ORAL | Status: DC

## 2019-01-03 MED ORDER — APIXABAN 5 MG PO TABS
10.0000 mg | ORAL_TABLET | Freq: Two times a day (BID) | ORAL | Status: DC
  Administered 2019-01-03 – 2019-01-09 (×12): 10 mg via ORAL
  Filled 2019-01-03 (×12): qty 2

## 2019-01-03 MED ORDER — CHLORHEXIDINE GLUCONATE CLOTH 2 % EX PADS
6.0000 | MEDICATED_PAD | Freq: Every day | CUTANEOUS | Status: DC
  Administered 2019-01-03 – 2019-01-09 (×7): 6 via TOPICAL

## 2019-01-03 NOTE — Progress Notes (Signed)
Family Update  Primary Nurse Spoke with patients daughter [name] concerning POC, status and discharge planning.

## 2019-01-03 NOTE — Progress Notes (Addendum)
Late Entry for 01/03/19 @ 2008: Daughter attempts to speak to nurse related update in plan of care for patient. Informed daughter that staff nurse was in room in another patient, and had not seen her dad at this time. Informed daughter that once patient was seen and assessed, daughter will be updated with plan of care.   Plan of Care Update: 01/03/19 @ 2125: Daughter makes second attempt to call staff nurse for update with plan of care. Explained to unit clerk that staff nurse will call daughter back.    Plan of Care Update: 12/3118 @ 2147: Spoke with daughter, Lance [name] [phone]). Update given on patient condition. Explained to daughter that patient is currently alert/oriented to person and place only; had to orient to time. Explained that patient is currently on 15 liters high flow nasal canula-explained that it is normal for patient to be on 15 liters high flow nasal canula with disease process, and staff will attempt to wean patient if tolerated. Also, explained process of proning patient if oxygen levels continue to drop. Patient are proned on stomach or side if patient oxygen levels are dropping. Explained that speech therapy saw patient today. Explained that patient is on dysphagia 2 and nectar thick liquids, and medications will be crushed and placed in applesauce. Inquiry made related to MD rounds in AM. Explained unsure of schedule tomorrow (New Year's Day) for physicians. Verbalized understanding.

## 2019-01-03 NOTE — Evaluation (Signed)
Clinical/Bedside Swallow Evaluation Patient Details  Name: Lance [name] MRN: [id number] Date of Birth: 1925-02-13  Today's Date: 01/03/2019 Time: SLP Start Time (ACUTE ONLY): 67 SLP Stop Time (ACUTE ONLY): 1430 SLP Time Calculation (min) (ACUTE ONLY): 25 min  Past Medical History:  Past Medical History:  Diagnosis Date  . Atrial fibrillation ([place])   . Chronic pain syndrome 10/20/2017  . Coronary artery disease   . Coronary artery disease involving native heart 04/14/2015  . Degenerative disc disease, cervical 10/20/2017  . Essential hypertension 04/14/2015  . Facet arthropathy, cervical 10/20/2017  . Hyperlipidemia 04/14/2015  . Long term current use of opiate analgesic 10/20/2017  . Myelodysplasia   . PAF (paroxysmal atrial fibrillation) ([place]) 04/14/2015  . Pancytopenia ([place])   . 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 CARDIAC CATHETERIZATION  03/2007  . CARDIAC CATHETERIZATION  01/1997  . CATARACT EXTRACTION    . CHOLECYSTECTOMY    . HIP SURGERY    . KNEE SURGERY     HPI:  85 year old with a history of CAD, CVA, HLD, paroxysmal atrial fibrillation, MDS, and hypothyroidism who lives independently and presented to [facility] 12/25 with shortness of breath for several days.   Assessment / Plan / Recommendation Clinical Impression  Although pt denies coughing, globus sensation, osteophytes or cervical arthritis prior to this admission, therapist suspects he may have a chronic on acute pharyngeal and/or cervical esophageal dysphagia. Gentle but consistent coughs following thin, nectar and honey consistency. He also attempted to cough up/expectorate then reswallow suspicious for residue. Honey thick liquids pose increased risk due to viscocity; nectar thick may provide appropriate thickness although cannot fully assess with clinical observation. Has only one lower tooth in  poor condition (recently removed?) and pt does not masticate foods that are hard. Pt in agreement for Dys 2 (minced) texture, nectar thick liquids, pills crush and continued ST and recommend instrumental assessment if needed.    SLP Visit Diagnosis: Dysphagia, unspecified (R13.10)    Aspiration Risk  Moderate aspiration risk    Diet Recommendation Dysphagia 2 (Fine chop);Nectar-thick liquid   Liquid Administration via: No straw;Cup Medication Administration: Crushed with puree Supervision: Patient able to self feed;Intermittent supervision to cue for compensatory strategies Compensations: Small sips/bites;Slow rate Postural Changes: Seated upright at 90 degrees    Other  Recommendations Oral Care Recommendations: Oral care BID Other Recommendations: Order thickener from pharmacy   Follow up Recommendations None      Frequency and Duration min 2x/week  2 weeks       Prognosis Prognosis for Safe Diet Advancement: (fair)      Swallow Study   General HPI: 85 year old with a history of CAD, CVA, HLD, paroxysmal atrial fibrillation, MDS, and hypothyroidism who lives independently and presented to [facility] 12/25 with shortness of breath for several days. Type of Study: Bedside Swallow Evaluation Previous Swallow Assessment: none Diet Prior to this Study: Regular;Thin liquids Temperature Spikes Noted: No Respiratory Status: (HFNC 15 L, non rebreather) History of Recent Intubation: No Behavior/Cognition: Alert;Cooperative;Pleasant mood Oral Care Completed by SLP: No Oral Cavity - Dentition: Dentures, top;Poor condition;Missing dentition(one lower) Vision: Functional for self-feeding Self-Feeding Abilities: Able to feed self Patient Positioning: Upright in bed Baseline Vocal Quality: Hoarse Volitional Cough: Strong Volitional Swallow: Able to elicit    Oral/Motor/Sensory Function Overall Oral Motor/Sensory Function: Within functional limits   Ice Chips Ice chips: Not  tested   Thin Liquid Thin  Liquid: Impaired Presentation: Cup Pharyngeal  Phase Impairments: Cough - Immediate    Nectar Thick Nectar Thick Liquid: Impaired Pharyngeal Phase Impairments: Cough - Immediate;Cough - Delayed   Honey Thick Honey Thick Liquid: Impaired Pharyngeal Phase Impairments: Cough - Delayed   Puree Puree: Within functional limits   Solid     Solid: Not tested      Houston Siren 01/03/2019,3:21 PM  Orbie Pyo Colvin Caroli.Ed Risk analyst [phone] Office [phone]

## 2019-01-03 NOTE — Progress Notes (Signed)
Lance [name]  [id number] DOB: Jul 26, 1925 DOA: 12/10/2018 PCP: Ernestene Kiel, MD    Brief Narrative:  85 year old with a history of CAD, CVA, HLD, paroxysmal atrial fibrillation, MDS, and hypothyroidism who lives independently and presented to [facility] 12/25 with shortness of breath for several days.  Significant Events: 12/26 admit to [facility] via [place] [facility] 12/31 profound desaturation working with therapy  COVID-19 specific Treatment: Decadron 12/25 > Remdesivir 12/25 > 12/29  Antimicrobials:  Levaquin 12/26 >  Subjective: The patient suffered an episode of profound desaturation when working with therapy today.  He required an extended period to recover with maximal oxygen delivery throughout.  His RN also noticed multiple choking episodes while attempting to consume water.  At the time of my visit his respiratory status is called.  He is resting comfortably in a bedside chair and able to come off the nonrebreather mask.  He denies chest pain nausea vomiting or abdominal pain.  Assessment & Plan:  COVID Pneumonia -acute hypoxic respiratory failure Continue Decadron - completed a course of remdesivir - continues to require significant oxygen support with exertion - wean oxygen as able - will clearly require a prolonged period for recovery (suggest SNF will be safest option)  L popliteal DVT Confirmed on venous duplex - transition to oral anticoag today   CAD No acute issues  HTN Blood pressure controlled at this time  HLD Continue home medical therapy  Paroxysmal atrial fibrillation NSR at time of exam today - unclear why patient not on anticoag (?advanced age - lives alone) - will need a 36mo course of anticoag for DVT   Myelodysplastic syndrome Counts stable on CBC  Hypothyroidism Continue home medical therapy  History of CVA  DVT prophylaxis: Lovenox Code Status: FULL CODE Family Communication:  Disposition Plan: medical bed - SNF  will be recommended as long term will be required for recovery   Consultants:  none  Objective: Blood pressure 121/66, pulse 77, temperature 98.1 F (36.7 C), temperature source Oral, resp. rate 18, height 5\' 9"  (1.753 m), weight 62.3 kg, SpO2 90 %. No intake or output data in the 24 hours ending 01/03/19 0956 Filed Weights   01/01/2019 0533  Weight: 62.3 kg    Examination: General: No acute respiratory distress Lungs: fine diffuse crackles - no wheezing  Cardiovascular: RRR  Abdomen: NT/ND, soft, BS positive Extremities: No significant edema bilateral lower extremities including left lower extremity with confirmed DVT   CBC: Recent Labs  Lab 12/30/18 0300 12/31/18 0340 01/01/19 0510 01/02/19 0100  WBC 7.3 6.8 7.2 7.7  NEUTROABS 6.3 5.7 5.7  --   HGB 11.4* 12.1* 13.0 12.8*  HCT 33.9* 36.8* 38.7* 37.7*  MCV 96.3 96.6 95.3 94.5  PLT 418* 394 411* [id number]   Basic Metabolic Panel: Recent Labs  Lab 12/31/18 0340 01/01/19 0510 01/02/19 0100  NA 141 138 138  K 4.4 3.9 4.3  CL 110 106 105  CO2 23 23 23   GLUCOSE 70 77 80  BUN 20 21 25*  CREATININE 0.79 0.89 0.87  CALCIUM 8.0* 7.9* 8.0*   GFR: Estimated Creatinine Clearance: 48.7 mL/min (by C-G formula based on SCr of 0.87 mg/dL).  Liver Function Tests: Recent Labs  Lab 12/09/2018 0505 12/30/18 0300 12/31/18 0340 01/01/19 0510  AST 37 47* 58* 40  ALT 17 21 25 24   ALKPHOS 67 59 64 66  BILITOT 0.8 0.7 1.0 0.9  PROT 6.1* 5.7* 5.7* 5.6*  ALBUMIN 2.1* 2.1* 2.3* 2.5*  Scheduled Meds: . vitamin C  500 mg Oral Daily  . aspirin EC  81 mg Oral Daily  . atorvastatin  10 mg Oral q1800  . dexamethasone  6 mg Oral Q24H  . docusate sodium  100 mg Oral Daily  . enoxaparin (LOVENOX) injection  1 mg/kg Subcutaneous Q12H  . famotidine  10 mg Oral Daily  . gabapentin  100 mg Oral QHS  . Ipratropium-Albuterol  1 puff Inhalation Q6H  . levothyroxine  50 mcg Oral QAC breakfast  . metoprolol succinate  50 mg Oral Daily  .  PARoxetine  20 mg Oral q morning - 10a  . zinc sulfate  220 mg Oral Daily      LOS: 5 days   Cherene Altes, MD [facility] Office  [phone] Pager - Text Page per Amion  If 7PM-7AM, please contact night-coverage per Amion 01/03/2019, 9:56 AM

## 2019-01-03 NOTE — Progress Notes (Signed)
Physical Therapy Treatment Patient Details Name: Lance [name] MRN: [id number] DOB: [birth year] Today's Date: 01/03/2019    History of Present Illness 85 year old with a history of CAD, CVA, HLD, paroxysmal atrial fibrillation, MDS, and hypothyroidism who lives independently and presented to [facility] 12/25 with shortness of breath for several days.    PT Comments    Pt did fairly well with therapy session this pm, initially was able to stand from recliner with hand held assist and reposition himself in chair with min-mod assist. Pt was on 15L/min HFNC at rest and sats in 90s, with activity added 15L via NRB as well to maintain sats in 90s, but was able to maintain sats in 90s to complete sit<>stand from recliner x 2 and also attempt to take few steps around bed. Did not complete ambulation as intended as pt c/o feeling fatigued and needing to sit down.     Follow Up Recommendations  Supervision for mobility/OOB     Equipment Recommendations  None recommended by PT    Recommendations for Other Services       Precautions / Restrictions Precautions Precautions: Fall Precaution Comments: 02 desaturation with activity Restrictions Weight Bearing Restrictions: No    Mobility  Bed Mobility Overal bed mobility: Needs Assistance Bed Mobility: Sit to Supine       Sit to supine: Min assist   General bed mobility comments: needs mod a x 2 for scooting up in bed and repositioning   Transfers Overall transfer level: Needs assistance Equipment used: Rolling walker (2 wheeled) Transfers: Sit to/from Stand Sit to Stand: Mod assist Stand pivot transfers: Mod assist          Ambulation/Gait         Gait velocity: decreased   General Gait Details: able to take few steps from bed to recliner, wanted to try a few more steps but noted that catheter had dislodged and  in process of attempting to fix pt reported being too fatigued   Stairs              Wheelchair Mobility    Modified Rankin (Stroke Patients Only)       Balance Overall balance assessment: Needs assistance Sitting-balance support: Feet supported Sitting balance-Leahy Scale: Good     Standing balance support: During functional activity;Bilateral upper extremity supported Standing balance-Leahy Scale: Fair                              Cognition Arousal/Alertness: Awake/alert Behavior During Therapy: WFL for tasks assessed/performed Overall Cognitive Status: Within Functional Limits for tasks assessed                                        Exercises      General Comments        Pertinent Vitals/Pain Pain Assessment: No/denies pain    Home Living                      Prior Function            PT Goals (current goals can now be found in the care plan section) Acute Rehab PT Goals Patient Stated Goal: to get better Time For Goal Achievement: 01/15/19 Potential to Achieve Goals: Fair Progress towards PT goals: PT to reassess next treatment    Frequency    Min  3X/week      PT Plan Other (comment)(will need to see how pt progresses in nest few days)    Co-evaluation              AM-PAC PT "6 Clicks" Mobility   Outcome Measure  Help needed turning from your back to your side while in a flat bed without using bedrails?: A Little Help needed moving from lying on your back to sitting on the side of a flat bed without using bedrails?: A Lot Help needed moving to and from a bed to a chair (including a wheelchair)?: A Lot Help needed standing up from a chair using your arms (e.g., wheelchair or bedside chair)?: A Lot Help needed to walk in hospital room?: A Lot Help needed climbing 3-5 steps with a railing? : A Lot 6 Click Score: 13    End of Session Equipment Utilized During Treatment: Oxygen Activity Tolerance: Patient limited by lethargy;Patient limited by fatigue Patient left: in bed;with  call bell/phone within reach;Other (comment)(SLP in room with pt) Nurse Communication: Mobility status;Other (comment)(dislodged catheter) PT Visit Diagnosis: Other abnormalities of gait and mobility (R26.89);Muscle weakness (generalized) (M62.81)     Time: [id number] PT Time Calculation (min) (ACUTE ONLY): 15 min  Charges:  $Therapeutic Activity: 8-22 mins                     Horald Chestnut, PT    Delford Field 01/03/2019, 3:40 PM

## 2019-01-03 NOTE — Progress Notes (Signed)
[place] for apixaban Indication: DVT  Allergies  Allergen Reactions  . Morphine And Related Nausea And Vomiting  . Penicillins Rash    Other reaction(s): Unknown    Patient Measurements: Height: 5\' 9"  (175.3 cm) Weight: 137 lb 5.6 oz (62.3 kg) IBW/kg (Calculated) : 70.7  Vital Signs: Temp: 98.2 F (36.8 C) (12/31 1200) Temp Source: Oral (12/31 1200) BP: 112/52 (12/31 1200) Pulse Rate: 79 (12/31 1200)  Labs: Recent Labs    01/01/19 0510 01/02/19 0100  HGB 13.0 12.8*  HCT 38.7* 37.7*  PLT 411* 361  CREATININE 0.89 0.87    Estimated Creatinine Clearance: 48.7 mL/min (by C-G formula based on SCr of 0.87 mg/dL).   Assessment: 6 yom on full dose Lovenox for L popliteal DVT to transition to apixaban. Last dose of Lovenox ~04:58 this morning. No bleeding noted, CBC is stable.  Plan:  Discontinue Lovenox Apixaban 10 mg PO bid for 7 days then 5 mg PO bid Needs education prior to discharge Pharmacy will sign-off consult but continue to follow peripherally   Thank you for involving pharmacy in this patient's care.  Renold Genta, PharmD, BCPS Clinical Pharmacist Clinical phone for 01/03/2019 until 4p is [phone] 01/03/2019 3:00 PM  **Pharmacist phone directory can be found on [email or web address] listed under [place]**

## 2019-01-03 NOTE — Progress Notes (Signed)
Occupational Therapy Evaluation Patient Details Name: MARTINA GIBBY MRN: [id number] DOB: 11/11/1925 Today's Date: 01/03/2019    History of Present Illness 85 year old with a history of CAD, CVA, HLD, paroxysmal atrial fibrillation, MDS, and hypothyroidism who lives independently and presented to [facility] 12/25 with shortness of breath for several days.   Clinical Impression   PTA, pt lived alone and was modified independent with ADL and mobility. Family assisted as needed with IADL tasks. On entry to room SpO2 91 on RA. Pt progressed to EOB the to [facility] and desat to low 70s on 6L. Nsg and RT called. Pt placed on HFNC with O2 sats increasing to low 80s. NRB added and SpO2 increased to low 90s. Mod A with mobility to chair and UB ADL and Max A with LB ADL. Pt coughing on all 3 sips of water - asked for Speech consult to assess swallow. MD notified. Will follow acutely. Pt wants to DC home, but will most likely need SNF for rehab  - pending progress.     Follow Up Recommendations  Home health OT;SNF;Supervision/Assistance - 24 hour(pending progress)    Equipment Recommendations  3 in 1 bedside commode    Recommendations for Other Services Speech consult     Precautions / Restrictions Precautions Precautions: Fall Restrictions Weight Bearing Restrictions: No      Mobility Bed Mobility Overal bed mobility: Needs Assistance Bed Mobility: Supine to Sit     Supine to sit: Mod assist;HOB elevated        Transfers Overall transfer level: Needs assistance Equipment used: 1 person hand held assist Transfers: Sit to/from [employer] Transfers Sit to Stand: Mod assist Stand pivot transfers: Mod assist            Balance Overall balance assessment: Needs assistance Sitting-balance support: Feet unsupported Sitting balance-Leahy Scale: Good     Standing balance support: During functional activity;Bilateral upper extremity supported Standing balance-Leahy  Scale: Poor                             ADL either performed or assessed with clinical judgement   ADL Overall ADL's : Needs assistance/impaired Eating/Feeding: Set up;Supervision/ safety Eating/Feeding Details (indicate cue type and reason): coughing on all 3 sips of water; difficulty manipulating O2 and utenisls; recommend S/set up to increase PO intake Grooming: Moderate assistance;Sitting   Upper Body Bathing: Moderate assistance;Sitting   Lower Body Bathing: Maximal assistance;Sit to/from stand   Upper Body Dressing : Moderate assistance;Sitting   Lower Body Dressing: Maximal assistance;Sit to/from stand   Toilet Transfer: Moderate assistance;Stand-pivot   Toileting- Clothing Manipulation and Hygiene: Maximal assistance       Functional mobility during ADLs: Moderate assistance General ADL Comments: ADL significantly limited by fatigue/ desat with any exertion     Vision         Perception     Praxis      Pertinent Vitals/Pain Pain Assessment: Faces Faces Pain Scale: Hurts a little bit Pain Location: general discomfort Pain Descriptors / Indicators: Discomfort Pain Intervention(s): Limited activity within patient's tolerance     Hand Dominance Right   Extremity/Trunk Assessment Upper Extremity Assessment Upper Extremity Assessment: Generalized weakness(B RTC insufficiency PTA; @ 30 degrees FF)   Lower Extremity Assessment Lower Extremity Assessment: Defer to PT evaluation   Cervical / Trunk Assessment Cervical / Trunk Assessment: Kyphotic   Communication Communication Communication: No difficulties   Cognition Arousal/Alertness: Awake/alert Behavior During Therapy: [facility] for  tasks assessed/performed Overall Cognitive Status: Within Functional Limits for tasks assessed                                     General Comments       Exercises Exercises: Other exercises   Shoulder Instructions      Home Living  Family/patient expects to be discharged to:: Private residence Living Arrangements: Alone Available Help at Discharge: Family Type of Home: House Home Access: Stairs to enter [employer] of Steps: 2   Home Layout: One level     Bathroom Shower/Tub: Walk-in shower;Tub/shower unit   Bathroom Toilet: Handicapped height Bathroom Accessibility: Yes How Accessible: Accessible via walker Home Equipment: Schley - 2 wheels;Cane - single point;Wheelchair - manual          Prior Functioning/Environment Level of Independence: Independent with assistive device(s)        Comments: uses either RW or cane        OT Problem List: Decreased strength;Decreased range of motion;Decreased activity tolerance;Impaired balance (sitting and/or standing);Decreased safety awareness;Decreased knowledge of use of DME or AE;Cardiopulmonary status limiting activity;Pain      OT Treatment/Interventions: Self-care/ADL training;Therapeutic exercise;Neuromuscular education;DME and/or AE instruction;Therapeutic activities;Patient/family education;Balance training    OT Goals(Current goals can be found in the care plan section) Acute Rehab OT Goals Patient Stated Goal: to get better OT Goal Formulation: With patient Time For Goal Achievement: 01/17/19 Potential to Achieve Goals: Good  OT Frequency: Min 3X/week   Barriers to D/C:            Co-evaluation              AM-PAC OT "6 Clicks" Daily Activity     Outcome Measure Help from another person eating meals?: A Little Help from another person taking care of personal grooming?: A Little Help from another person toileting, which includes using toliet, bedpan, or urinal?: A Lot Help from another person bathing (including washing, rinsing, drying)?: A Lot Help from another person to put on and taking off regular upper body clothing?: A Lot Help from another person to put on and taking off regular lower body clothing?: A Lot 6 Click  Score: 14   End of Session Equipment Utilized During Treatment: Oxygen Nurse Communication: Mobility status;Other (comment)(O2 sats)  Activity Tolerance: Treatment limited secondary to medical complications (Comment)(desat into 70s. Now on NRB and HFNC) Patient left: in chair;with call bell/phone within reach;with chair alarm set  OT Visit Diagnosis: Unsteadiness on feet (R26.81);Other abnormalities of gait and mobility (R26.89);Muscle weakness (generalized) (M62.81);Pain Pain - part of body: (generalized)                Time: [id number] OT Time Calculation (min): 65 min Charges:  OT General Charges $OT Visit: 1 Visit OT Evaluation $OT Eval Moderate Complexity: 1 Mod OT Treatments $Self Care/Home Management : 38-52 mins  Maurie Boettcher, OT/L   Acute OT Clinical Specialist Acute Rehabilitation Services Pager [phone] Office [phone]   [facility] 01/03/2019, 10:11 AM

## 2019-01-04 NOTE — Progress Notes (Signed)
Lance [name]  [id number] DOB: 1925-08-10 DOA: 12/22/2018 PCP: Ernestene Kiel, MD    Brief Narrative:  86 year old with a history of CAD, CVA, HLD, paroxysmal atrial fibrillation, MDS, and hypothyroidism who lives independently and presented to [facility] 12/25 with shortness of breath for several days.  Significant Events: 12/26 admit to [facility] via [place] [facility] 12/31 profound desaturation working with therapy  COVID-19 specific Treatment: Decadron 12/25 > Remdesivir 12/25 > 12/29  Antimicrobials:  Levaquin 12/26 >  Subjective: Cleared for D2 diet with nectar thick liquids per SLP.  Required 15 L nonrebreather mask to keep sats in 90s while participating with PT yesterday.  No new complaints today.  Is awake alert and conversant.  Assessment & Plan:  COVID Pneumonia -acute hypoxic respiratory failure Continue Decadron - completed a course of remdesivir - continues to require significant oxygen support with exertion - wean oxygen as able - will clearly require a prolonged period for recovery (suggest SNF will be safest option)  L popliteal DVT Confirmed on venous duplex - cont oral anticoag  CAD No acute issues  HTN Blood pressure controlled at this time  HLD Continue home medical therapy  Paroxysmal atrial fibrillation NSR at time of exam today - unclear why patient not on anticoag (?advanced age - lives alone) - will need a 61mo course of anticoag for DVT   Myelodysplastic syndrome Counts stable on CBC  Hypothyroidism Continue home medical therapy  History of CVA  DVT prophylaxis: Lovenox Code Status: FULL CODE Family Communication:  Disposition Plan: medical bed - SNF recommended as long period will be required for recovery   Consultants:  none  Objective: Blood pressure 122/66, pulse 71, temperature (!) 97.2 F (36.2 C), temperature source Oral, resp. rate 20, height 5\' 9"  (1.753 m), weight 62.3 kg, SpO2 100 %.  Intake/Output  Summary (Last 24 hours) at 01/04/2019 0951 Last data filed at 01/04/2019 0606 Gross per 24 hour  Intake 120 ml  Output 1050 ml  Net -930 ml   Filed Weights   12/11/2018 0533  Weight: 62.3 kg    Examination: General: No acute respiratory distress Lungs: fine diffuse crackles  Cardiovascular: RRR  Abdomen: NT/ND, soft, BS positive Extremities: No significant edema B LE    CBC: Recent Labs  Lab 12/30/18 0300 12/31/18 0340 01/01/19 0510 01/02/19 0100  WBC 7.3 6.8 7.2 7.7  NEUTROABS 6.3 5.7 5.7  --   HGB 11.4* 12.1* 13.0 12.8*  HCT 33.9* 36.8* 38.7* 37.7*  MCV 96.3 96.6 95.3 94.5  PLT 418* 394 411* [id number]   Basic Metabolic Panel: Recent Labs  Lab 12/31/18 0340 01/01/19 0510 01/02/19 0100  NA 141 138 138  K 4.4 3.9 4.3  CL 110 106 105  CO2 23 23 23   GLUCOSE 70 77 80  BUN 20 21 25*  CREATININE 0.79 0.89 0.87  CALCIUM 8.0* 7.9* 8.0*   GFR: Estimated Creatinine Clearance: 48.7 mL/min (by C-G formula based on SCr of 0.87 mg/dL).  Liver Function Tests: Recent Labs  Lab 12/20/2018 0505 12/30/18 0300 12/31/18 0340 01/01/19 0510  AST 37 47* 58* 40  ALT 17 21 25 24   ALKPHOS 67 59 64 66  BILITOT 0.8 0.7 1.0 0.9  PROT 6.1* 5.7* 5.7* 5.6*  ALBUMIN 2.1* 2.1* 2.3* 2.5*     Scheduled Meds: . apixaban  10 mg Oral Q12H   Followed by  . [START ON 01/10/2019] apixaban  5 mg Oral Q12H  . vitamin C  500 mg Oral Daily  .  aspirin EC  81 mg Oral Daily  . atorvastatin  10 mg Oral q1800  . Chlorhexidine Gluconate Cloth  6 each Topical Daily  . dexamethasone  6 mg Oral Q24H  . docusate sodium  100 mg Oral Daily  . famotidine  10 mg Oral Daily  . gabapentin  100 mg Oral QHS  . Ipratropium-Albuterol  1 puff Inhalation Q6H  . levothyroxine  50 mcg Oral QAC breakfast  . metoprolol succinate  50 mg Oral Daily  . PARoxetine  20 mg Oral q morning - 10a  . zinc sulfate  220 mg Oral Daily      LOS: 6 days   Cherene Altes, MD [facility] Office  [phone] Pager -  Text Page per Amion  If 7PM-7AM, please contact night-coverage per Amion 01/04/2019, 9:51 AM

## 2019-01-04 NOTE — Discharge Instructions (Signed)
Information on my medicine - ELIQUIS (apixaban)  This medication education was reviewed with me or my healthcare representative as part of my discharge preparation.  The pharmacist that spoke with me during my hospital stay was:  Mada Sadik Z  Adalay Azucena,, RPH  Why was Eliquis prescribed for you? Eliquis was prescribed to treat blood clots that may have been found in the veins of your legs (deep vein thrombosis) or in your lungs (pulmonary embolism) and to reduce the risk of them occurring again.  What do You need to know about Eliquis ? The starting dose is 10 mg (two 5 mg tablets) taken TWICE daily for the FIRST SEVEN (7) DAYS, then on (enter date)  January 10, 2019 in the evening  the dose is reduced to ONE 5 mg tablet taken TWICE daily.  Eliquis may be taken with or without food.   Try to take the dose about the same time in the morning and in the evening. If you have difficulty swallowing the tablet whole please discuss with your pharmacist how to take the medication safely.  Take Eliquis exactly as prescribed and DO NOT stop taking Eliquis without talking to the doctor who prescribed the medication.  Stopping may increase your risk of developing a new blood clot.  Refill your prescription before you run out.  After discharge, you should have regular check-up appointments with your healthcare provider that is prescribing your Eliquis.    What do you do if you miss a dose? If a dose of ELIQUIS is not taken at the scheduled time, take it as soon as possible on the same day and twice-daily administration should be resumed. The dose should not be doubled to make up for a missed dose.  Important Safety Information A possible side effect of Eliquis is bleeding. You should call your healthcare provider right away if you experience any of the following: ? Bleeding from an injury or your nose that does not stop. ? Unusual colored urine (red or dark brown) or unusual colored stools (red or  black). ? Unusual bruising for unknown reasons. ? A serious fall or if you hit your head (even if there is no bleeding).  Some medicines may interact with Eliquis and might increase your risk of bleeding or clotting while on Eliquis. To help avoid this, consult your healthcare provider or pharmacist prior to using any new prescription or non-prescription medications, including herbals, vitamins, non-steroidal anti-inflammatory drugs (NSAIDs) and supplements.  This website has more information on Eliquis (apixaban): http://[email or web address].com/eliquis/home

## 2019-01-04 DEATH — deceased

## 2019-01-05 NOTE — Progress Notes (Signed)
Physical Therapy Treatment Patient Details Name: Lance [name] MRN: [id number] DOB: July 08, 1925 Today's Date: 01/05/2019    History of Present Illness 86 year old with a history of CAD, CVA, HLD, paroxysmal atrial fibrillation, MDS, and hypothyroidism who lives independently and presented to [facility] 12/25 with shortness of breath for several days.    PT Comments    Pt tolerated tx well today, was able to ambulate approx 22ft with RW and min-mod a, pt was on 9L/min via HFNC and was noted to briefly desat to 85% while ambulating. Overall making progress towards goals.   Follow Up Recommendations  Supervision for mobility/OOB     Equipment Recommendations  None recommended by PT    Recommendations for Other Services       Precautions / Restrictions Precautions Precautions: Fall Precaution Comments: 02 desaturation with activity Restrictions Weight Bearing Restrictions: No    Mobility  Bed Mobility Overal bed mobility: Needs Assistance Bed Mobility: Sit to Supine     Supine to sit: Mod assist;HOB elevated        Transfers Overall transfer level: Needs assistance Equipment used: Rolling walker (2 wheeled) Transfers: Sit to/from Stand Sit to Stand: Min assist Stand pivot transfers: Min assist          Ambulation/Gait Ambulation/Gait assistance: Min Web designer (Feet): 30 Feet Assistive device: Rolling walker (2 wheeled) Gait Pattern/deviations: Step-through pattern     General Gait Details: on 9L/min vai HFNC and desat to min 85% with ambulation    Stairs             Wheelchair Mobility    Modified Rankin (Stroke Patients Only)       Balance Overall balance assessment: Needs assistance Sitting-balance support: Feet supported;Bilateral upper extremity supported Sitting balance-Leahy Scale: Fair   Postural control: Posterior lean;Left lateral lean Standing balance support: During functional activity;Bilateral upper  extremity supported Standing balance-Leahy Scale: Fair                              Cognition Arousal/Alertness: Awake/alert Behavior During Therapy: WFL for tasks assessed/performed Overall Cognitive Status: No family/caregiver present to determine baseline cognitive functioning                                        Exercises      General Comments        Pertinent Vitals/Pain Pain Assessment: No/denies pain    Home Living                      Prior Function            PT Goals (current goals can now be found in the care plan section) Acute Rehab PT Goals Patient Stated Goal: states has been better PT Goal Formulation: With patient Time For Goal Achievement: 01/15/19 Potential to Achieve Goals: Fair Progress towards PT goals: Progressing toward goals    Frequency    Min 3X/week      PT Plan Current plan remains appropriate    Co-evaluation              AM-PAC PT "6 Clicks" Mobility   Outcome Measure  Help needed turning from your back to your side while in a flat bed without using bedrails?: A Little Help needed moving from lying on your back to sitting on the side  of a flat bed without using bedrails?: A Lot Help needed moving to and from a bed to a chair (including a wheelchair)?: A Lot Help needed standing up from a chair using your arms (e.g., wheelchair or bedside chair)?: A Lot Help needed to walk in hospital room?: A Lot Help needed climbing 3-5 steps with a railing? : A Lot 6 Click Score: 13    End of Session Equipment Utilized During Treatment: Oxygen Activity Tolerance: Patient limited by fatigue;Treatment limited secondary to medical complications (Comment) Patient left: in chair;with call bell/phone within reach Nurse Communication: Mobility status PT Visit Diagnosis: Other abnormalities of gait and mobility (R26.89);Muscle weakness (generalized) (M62.81)     Time: 1341-1400 PT Time Calculation  (min) (ACUTE ONLY): 19 min  Charges:  $Gait Training: 8-22 mins                     Horald Chestnut, PT    Delford Field 01/05/2019, 4:24 PM

## 2019-01-05 NOTE — Progress Notes (Addendum)
  Family Update/Plan of Care: Spoke with patient's daughter Lance [name] [phone]). Update on patient condition given. Explained that patient had uneventful night with no fevers. Patient also tolerated medication pass. Reminded daughter that patient had foley cath in place second to urinary retention.-daughter verbalized understanding.   Plan of Care Update: 01/06/19 @ 0703: Spoke with patient's daughter Lance [name] [phone]). Update on patient condition given. Explained that patient oxygen has been decreased from 10 liters hight flow nasal canula to 8 liters high flow nasal canula.Explained that patient had uneventful evening and sat in bedside chair on dayshift; explained that MD want all patients sitting in chair for 6-8 hours. Verbalized understanding

## 2019-01-05 NOTE — Progress Notes (Signed)
Lance [name]  [id number] DOB: Nov 18, 1925 DOA: 12/20/2018 PCP: Ernestene Kiel, MD    Brief Narrative:  86 year old with a history of CAD, CVA, HLD, paroxysmal atrial fibrillation, MDS, and hypothyroidism who lives independently and presented to [facility] 12/25 with shortness of breath for several days.  Significant Events: 12/26 admit to [facility] via [place] ED 12/31 profound desaturation working with therapy  COVID-19 specific Treatment: Decadron 12/25 > 1/3 Remdesivir 12/25 > 12/29  Antimicrobials:  Levaquin 12/26 >  Subjective: Continues to require high flow nasal cannula support.  Is alert and oriented and in good spirits though appears to be severely weak.  Assessment & Plan:  COVID Pneumonia -acute hypoxic respiratory failure Continue Decadron - completed a course of remdesivir - continues to require significant oxygen support with exertion - wean oxygen as able - will clearly require a prolonged period for recovery (suggest SNF will be safest option)  L popliteal DVT Confirmed on venous duplex - cont oral anticoag  CAD No acute issues  HTN Blood pressure controlled at this time  HLD Continue home medical therapy  Paroxysmal atrial fibrillation NSR at time of exam today - unclear why patient not on anticoag (?advanced age - lives alone) - will need a 64mo course of anticoag for DVT   Myelodysplastic syndrome Counts stable on CBC  Hypothyroidism Continue home medical therapy  History of CVA  DVT prophylaxis: Lovenox Code Status: FULL CODE Family Communication:  Disposition Plan: medical bed - SNF recommended as long period will be required for recovery   Consultants:  none  Objective: Blood pressure 100/67, pulse (!) 59, temperature (!) 97.4 F (36.3 C), temperature source Oral, resp. rate 14, height 5\' 9"  (1.753 m), weight 62.3 kg, SpO2 100 %.  Intake/Output Summary (Last 24 hours) at 01/05/2019 0907 Last data filed at  01/04/2019 2031 Gross per 24 hour  Intake 1420 ml  Output --  Net 1420 ml   Filed Weights   12/18/2018 0533  Weight: 62.3 kg    Examination: General: No acute respiratory distress Lungs: fine diffuse crackles  Cardiovascular: RRR  Abdomen: NT/ND, soft, BS positive Extremities: No significant edema B LE    CBC: Recent Labs  Lab 12/30/18 0300 12/31/18 0340 01/01/19 0510 01/02/19 0100  WBC 7.3 6.8 7.2 7.7  NEUTROABS 6.3 5.7 5.7  --   HGB 11.4* 12.1* 13.0 12.8*  HCT 33.9* 36.8* 38.7* 37.7*  MCV 96.3 96.6 95.3 94.5  PLT 418* 394 411* [id number]   Basic Metabolic Panel: Recent Labs  Lab 12/31/18 0340 01/01/19 0510 01/02/19 0100  NA 141 138 138  K 4.4 3.9 4.3  CL 110 106 105  CO2 23 23 23   GLUCOSE 70 77 80  BUN 20 21 25*  CREATININE 0.79 0.89 0.87  CALCIUM 8.0* 7.9* 8.0*   GFR: Estimated Creatinine Clearance: 48.7 mL/min (by C-G formula based on SCr of 0.87 mg/dL).  Liver Function Tests: Recent Labs  Lab 12/30/18 0300 12/31/18 0340 01/01/19 0510  AST 47* 58* 40  ALT 21 25 24   ALKPHOS 59 64 66  BILITOT 0.7 1.0 0.9  PROT 5.7* 5.7* 5.6*  ALBUMIN 2.1* 2.3* 2.5*     Scheduled Meds: . apixaban  10 mg Oral Q12H   Followed by  . [START ON 01/10/2019] apixaban  5 mg Oral Q12H  . vitamin C  500 mg Oral Daily  . atorvastatin  10 mg Oral q1800  . Chlorhexidine Gluconate Cloth  6 each Topical Daily  . dexamethasone  6 mg Oral Q24H  . docusate sodium  100 mg Oral Daily  . famotidine  10 mg Oral Daily  . gabapentin  100 mg Oral QHS  . Ipratropium-Albuterol  1 puff Inhalation Q6H  . levothyroxine  50 mcg Oral QAC breakfast  . metoprolol succinate  50 mg Oral Daily  . PARoxetine  20 mg Oral q morning - 10a  . zinc sulfate  220 mg Oral Daily      LOS: 7 days   Cherene Altes, MD [facility] Office  [phone] Pager - Text Page per Amion  If 7PM-7AM, please contact night-coverage per Amion 01/05/2019, 9:07 AM

## 2019-01-06 LAB — CBC
HCT: 42.6 % (ref 39.0–52.0)
Hemoglobin: 14.7 g/dL (ref 13.0–17.0)
MCH: 32.7 pg (ref 26.0–34.0)
MCHC: 34.5 g/dL (ref 30.0–36.0)
MCV: 94.7 fL (ref 80.0–100.0)
Platelets: 236 10*3/uL (ref 150–400)
RBC: 4.5 MIL/uL (ref 4.22–5.81)
RDW: 14.1 % (ref 11.5–15.5)
WBC: 10.9 10*3/uL — ABNORMAL HIGH (ref 4.0–10.5)
nRBC: 0 % (ref 0.0–0.2)

## 2019-01-06 NOTE — Progress Notes (Addendum)
On call provider notified r/t nurse concerns about low BP readings,.   MD Communication 01/06/19@ 2324: On call provider (Dr. Loralee Pacas) text page r/t staf nurse concern for low urinary output (165ml since 1900) and loss of IV access. Informed that IV therapy has been notified and suggestion made for start of IV fluids   MD Communications 01/07/19 @ 0104: On call provider (Dr. Loralee Pacas) text paged r/t staff nurse continued concern for low urinary output (100 ml since 1900) and no IV access. Suggestion made for PO direuetic therapy if appropriate.   MD Communication 01/08/19 @ [phone]: On call provider notified r/t nurses continued concern r/t low urinary output (250 ml in 12 hours= 20.83 ml/hr).

## 2019-01-06 NOTE — Progress Notes (Addendum)
Lance [name]  [id number] DOB: [birth year] DOA: 12/25/2018 PCP: Ernestene Kiel, MD    Brief Narrative:  86 year old with a history of CAD, CVA, HLD, paroxysmal atrial fibrillation, MDS, and hypothyroidism who lives independently and presented to [facility] 12/25 with shortness of breath for several days.  Significant Events: 12/26 admit to [facility] via [place] ED 12/31 profound desaturation working with therapy  COVID-19 specific Treatment: Decadron 12/26 > 1/4 Remdesivir 12/26 > 12/30  Antimicrobials:  Levaquin 12/26 >  Subjective: Per PT yesterday afternoon was able to "ambulate approx 21ft with RW and min-mod a... on 9L/min via HFNC... briefly desat to 85% while ambulating".  Has been weaned further to 8 L high flow nasal cannula this morning.  Is awake and conversant but states he feels very tired.  Oriented and pleasant.  Assessment & Plan:  COVID Pneumonia -acute hypoxic respiratory failure Completes his course of Decadron tomorrow - completed a course of remdesivir - continues to require significant oxygen support with exertion - wean oxygen as able - will clearly require a prolonged period for recovery (suggest SNF will be safest option)  L popliteal DVT Confirmed on venous duplex - cont oral anticoag  CAD No acute issues  HTN Blood pressure controlled at this time  HLD Continue home medical therapy  Paroxysmal atrial fibrillation NSR today - unclear why patient not on anticoag (?advanced age - lives alone) - will need a 75mo course of anticoag for DVT   Myelodysplastic syndrome Counts stable on CBC  Hypothyroidism Continue home medical therapy  History of CVA  DVT prophylaxis: Lovenox Code Status: FULL CODE Family Communication:  Disposition Plan: medical bed - SNF recommended as long period will be required for recovery   Consultants:  none  Objective: Blood pressure 126/66, pulse (!) 59, temperature 98.6 F (37 C),  temperature source Oral, resp. rate 20, height 5\' 9"  (1.753 m), weight 62.3 kg, SpO2 100 %.  Intake/Output Summary (Last 24 hours) at 01/06/2019 0914 Last data filed at 01/06/2019 0641 Gross per 24 hour  Intake 600 ml  Output 850 ml  Net -250 ml   Filed Weights   12/30/2018 0533  Weight: 62.3 kg    Examination: General: No acute respiratory distress at rest Lungs: fine diffuse crackles  Cardiovascular: RRR  Abdomen: NT/ND, soft, BS positive Extremities: No edema B LE    CBC: Recent Labs  Lab 12/31/18 0340 01/01/19 0510 01/02/19 0100 01/06/19 0119  WBC 6.8 7.2 7.7 10.9*  NEUTROABS 5.7 5.7  --   --   HGB 12.1* 13.0 12.8* 14.7  HCT 36.8* 38.7* 37.7* 42.6  MCV 96.6 95.3 94.5 94.7  PLT 394 411* 361 [id number]   Basic Metabolic Panel: Recent Labs  Lab 12/31/18 0340 01/01/19 0510 01/02/19 0100  NA 141 138 138  K 4.4 3.9 4.3  CL 110 106 105  CO2 23 23 23   GLUCOSE 70 77 80  BUN 20 21 25*  CREATININE 0.79 0.89 0.87  CALCIUM 8.0* 7.9* 8.0*   GFR: Estimated Creatinine Clearance: 48.7 mL/min (by C-G formula based on SCr of 0.87 mg/dL).  Liver Function Tests: Recent Labs  Lab 12/31/18 0340 01/01/19 0510  AST 58* 40  ALT 25 24  ALKPHOS 64 66  BILITOT 1.0 0.9  PROT 5.7* 5.6*  ALBUMIN 2.3* 2.5*     Scheduled Meds: . apixaban  10 mg Oral Q12H   Followed by  . [START ON 01/10/2019] apixaban  5 mg Oral Q12H  . vitamin C  500 mg Oral Daily  . atorvastatin  10 mg Oral q1800  . Chlorhexidine Gluconate Cloth  6 each Topical Daily  . dexamethasone  6 mg Oral Q24H  . docusate sodium  100 mg Oral Daily  . famotidine  10 mg Oral Daily  . gabapentin  100 mg Oral QHS  . Ipratropium-Albuterol  1 puff Inhalation Q6H  . levothyroxine  50 mcg Oral QAC breakfast  . metoprolol succinate  50 mg Oral Daily  . PARoxetine  20 mg Oral q morning - 10a  . zinc sulfate  220 mg Oral Daily      LOS: 8 days   Cherene Altes, MD [facility] Office  [phone] Pager - Text Page  per Amion  If 7PM-7AM, please contact night-coverage per Amion 01/06/2019, 9:14 AM

## 2019-01-07 NOTE — Progress Notes (Signed)
I assessed Mr. Myrtie Neither peripheral vasculature for potential IV placement under ultrasound guidance.  I did not find any accessible collapsible veins without superficial clots.  Collapsible veins can be visualized above the elbow, but I will save those veins for PICC or midline placement by VAT team at later time.  Dr. Shanon Brow notified of superficial clots and recommended PICC or midline if IV access is necessary.

## 2019-01-07 NOTE — Progress Notes (Signed)
Occupational Therapy Treatment Patient Details Name: Lance [name] MRN: [id number] DOB: September 15, 1925 Today's Date: 01/07/2019    History of present illness 86 year old with a history of CAD, CVA, HLD, paroxysmal atrial fibrillation, MDS, and hypothyroidism who lives independently and presented to [facility] 12/25 with shortness of breath for several days.   OT comments  Upon arrival, pt sitting in recliner with BLEs elevated; soft BP at 90s/50s and SpO2 98% on 4L via HFNC. Pt recently transferred to recliner with RN. Recline pt back in chair and facilitate BLE ROM; retake BP and continues to take at 80s/50s and 70s/50s (MAP in 70s). RN notified. Pt performing sit<>stand with Min A and RW; BP elevating to 114/77. Pt performing short distance mobility to BSC; SpO2 dropping into 80s and elevate O2 to 6-8L via HFNC. Pt returns to recliner and BP drops 90/54 (64) and SpO2 80-70s on 8L O2. RN notified. Elevating O2 to 15L via HFNC and SpO2 maintaining ~85%. Returning pt to bed; SpO2 fluctuating 94-85% on 15L and BP 118/75 (90). Pending pt's progress, pt may need post-acute rehab. Will continue to follow acutely as admitted.    Follow Up Recommendations  Home health OT;SNF;Supervision/Assistance - 24 hour(pending progress)    Equipment Recommendations  3 in 1 bedside commode    Recommendations for Other Services Speech consult    Precautions / Restrictions Precautions Precautions: Fall Precaution Comments: 02 desaturation with activity Restrictions Weight Bearing Restrictions: No       Mobility Bed Mobility Overal bed mobility: Needs Assistance Bed Mobility: Sit to Supine       Sit to supine: Mod assist;+2 for physical assistance   General bed mobility comments: Mod A for elevating legs and softly lowering trunk  Transfers Overall transfer level: Needs assistance Equipment used: Rolling walker (2 wheeled) Transfers: Sit to/from Stand Sit to Stand: Min assist          General transfer comment: Min A to power up into standing    Balance Overall balance assessment: Needs assistance Sitting-balance support: Feet supported;Bilateral upper extremity supported Sitting balance-Leahy Scale: Fair     Standing balance support: During functional activity;Bilateral upper extremity supported Standing balance-Leahy Scale: Fair                             ADL either performed or assessed with clinical judgement   ADL Overall ADL's : Needs assistance/impaired                         Toilet Transfer: Minimal assistance;Ambulation;RW;BSC Toilet Transfer Details (indicate cue type and reason): Min A to power up and for safe descent         Functional mobility during ADLs: Minimal assistance;Rolling walker General ADL Comments: Performing mobility at [place] A level. Motivated to participate. Pt with decreased BP; RN notified.      Vision       Perception     Praxis      Cognition Arousal/Alertness: Awake/alert Behavior During Therapy: WFL for tasks assessed/performed Overall Cognitive Status: No family/caregiver present to determine baseline cognitive functioning                                 General Comments: Following commands and maintaining conversation        Exercises Exercises: General Lower Extremity General Exercises - Lower Extremity Ankle Circles/Pumps: AAROM;Both;15 reps;Supine;Other (  comment)(In recliner) Hip Flexion/Marching: AAROM;Both;20 reps;Seated;Other (comment)(Reclined in recliner with BLEs elevated)   Shoulder Instructions       General Comments SpO2 90s on 4L via HFNC. Pt with soft BP upon arrival; RN having recently transfered pt to recliner. Checking pt BP after reclining him, elevating BLEs, and performing BLE ROM. BP continued to range in 80s/50s or 70s/50s (MAP in 70s). Notified RN. When pt standing, BP elevating to 114/77 (90). transfered to [facility]. SpO2 dropping to 80-70s on 6-8L.  Returned to recliner and BP dropped again and SpO2 maintained in 70-80s. Elevated O2 to 15L via HFNC and pt Spo2 90-85%. RN present. Returned to bed. BP 118/75 (90) and SpO2 94-85% on 15L via HFNC    Pertinent Vitals/ Pain       Pain Assessment: No/denies pain  Home Living                                          Prior Functioning/Environment              Frequency  Min 3X/week        Progress Toward Goals  OT Goals(current goals can now be found in the care plan section)  Progress towards OT goals: Progressing toward goals  Acute Rehab OT Goals Patient Stated Goal: states has been better OT Goal Formulation: With patient Time For Goal Achievement: 01/17/19 Potential to Achieve Goals: Good ADL Goals Pt Will Perform Eating: with modified independence;sitting Pt Will Perform Grooming: with set-up;sitting Pt Will Perform Upper Body Bathing: with set-up;sitting Pt Will Perform Lower Body Bathing: with min guard assist;sit to/from stand Pt Will Transfer to Toilet: bedside commode;ambulating;with min assist Pt Will Perform Toileting - Clothing Manipulation and hygiene: with min guard assist;sitting/lateral leans;sit to/from stand Additional ADL Goal #1: Pt will complete ADL task with SpO2 remaining above 88 using energy conservation strategies with min vc  Plan Discharge plan remains appropriate    Co-evaluation                 AM-PAC OT "6 Clicks" Daily Activity     Outcome Measure   Help from another person eating meals?: A Little Help from another person taking care of personal grooming?: A Little Help from another person toileting, which includes using toliet, bedpan, or urinal?: A Lot Help from another person bathing (including washing, rinsing, drying)?: A Lot Help from another person to put on and taking off regular upper body clothing?: A Lot Help from another person to put on and taking off regular lower body clothing?: A Lot 6 Click  Score: 14    End of Session Equipment Utilized During Treatment: Oxygen  OT Visit Diagnosis: Unsteadiness on feet (R26.81);Other abnormalities of gait and mobility (R26.89);Muscle weakness (generalized) (M62.81);Pain Pain - part of body: (generalized)   Activity Tolerance Treatment limited secondary to medical complications (Comment)(Desat into 70s and requiring 15L via HFNC)   Patient Left with call bell/phone within reach;in bed;with nursing/sitter in room   Nurse Communication Mobility status;Other (comment)(O2 sats and BP)        Time: [id number] OT Time Calculation (min): 62 min  Charges: OT General Charges $OT Visit: 1 Visit OT Treatments $Self Care/Home Management : 38-52 mins $Therapeutic Activity: 8-22 mins  [place], OTR/L Acute Rehab Pager: [phone] Office: [place] 01/07/2019, 5:24 PM

## 2019-01-07 NOTE — Progress Notes (Signed)
  Speech Language Pathology Treatment: Dysphagia  Patient Details Name: Lance [name] MRN: [id number] DOB: April 21, 1925 Today's Date: 01/07/2019 Time: 1205-1224 SLP Time Calculation (min) (ACUTE ONLY): 19 min  Assessment / Plan / Recommendation Clinical Impression  Pt with no meaningful improvements in swallowing since last seen by SLP on 12/31.  Upper dentures placed; pt reports soreness on gums and tongue. Upon inspection, tongue has four-five tiny abrasions/cuts on superior surface that are really irritating him. Pt very pleasant, verbalizes the challenges of being hospitalized when he is used to being active (retired [employer] account planner).  He continues to demonstrate consistent coughing when drinking thin liquids, to lesser degree with nectars.  No IV available for hydration. He points to base of throat and endorses "something caught." He may benefit from [facility] to elucidate source of dysphagia and help with decision-making about nutrition.  Will plan for this afternoon or tomorrow, depending on radiology scheduling. D/W RN.      HPI HPI: 86 year old with a history of CAD, CVA, HLD, paroxysmal atrial fibrillation, MDS, and hypothyroidism who lives independently and presented to [facility] 12/25 with shortness of breath for several days. Dx with COVID pna and transferred to [place].       SLP Plan  Continue with current plan of care       Recommendations  Diet recommendations: Dysphagia 2 (fine chop);Nectar-thick liquid Liquids provided via: Cup Medication Administration: Crushed with puree Supervision: Patient able to self feed Compensations: Small sips/bites;Slow rate Postural Changes and/or Swallow Maneuvers: Seated upright 90 degrees                Oral Care Recommendations: Oral care BID Follow up Recommendations: None SLP Visit Diagnosis: Dysphagia, unspecified (R13.10) Plan: Continue with current plan of care       GO                Juan Quam  Laurice 01/07/2019, 12:28 PM  Waunetta Riggle L. Tivis Ringer, [place] Office number [phone]

## 2019-01-07 NOTE — Progress Notes (Addendum)
Paged Dr. Shanon Brow to notify him about oxygen requirements and patient's intake/output as well as no IV access currently.   Patient sats low and maintaining in the low 80s.  Placed patient on 15L NRB.  Assisted patient on his side multiple times and it helps but patient turns back on his back soon after.  Educated patient.  Foley in place.  MD aware of patient's poor intake and output.  No IV access and midline/PICC placement cancelled today.

## 2019-01-07 NOTE — Progress Notes (Signed)
Spoke with daughter [name] again and gave update and answered all questions.

## 2019-01-07 NOTE — Progress Notes (Signed)
Lance [name]  [id number] DOB: 25-Jun-1925 DOA: 12/23/2018 PCP: Ernestene Kiel, MD    Brief Narrative:  86 year old with a history of CAD, CVA, HLD, paroxysmal atrial fibrillation, MDS, and hypothyroidism who lives independently and presented to [facility] 12/25 with shortness of breath for several days.  Significant Events: 12/26 admit to [facility] via [place] ED 12/31 profound desaturation working with therapy  COVID-19 specific Treatment: Decadron 12/26 > 1/4 Remdesivir 12/26 > 12/30  Antimicrobials:  Levaquin 12/26 >  Subjective: The patient lost his IV access last night and a peripheral IV was not able to be obtained.  This morning his vitals are stable.  His saturations are 100% on 8 L high flow nasal cannula so hopefully this can be titrated downward.  He is alert and pleasant but quite frail.  Assessment & Plan:  COVID Pneumonia -acute hypoxic respiratory failure Completes his course of Decadron today - completed a course of remdesivir - continues to require significant oxygen support with exertion - attempt to wean oxygen as able - will clearly require a prolonged period for recovery (suggest SNF will be safest option)  L popliteal DVT Confirmed on venous duplex - cont oral anticoag  CAD No acute issues  HTN Blood pressure controlled  HLD Continue home medical therapy  Paroxysmal atrial fibrillation NSR today - unclear why patient not on outpt, anticoag (?advanced age - lives alone) - will need a 47mo course of anticoag for DVT   Myelodysplastic syndrome Counts stable on CBC  Hypothyroidism Continue home medical therapy  History of CVA  DVT prophylaxis: Lovenox Code Status: FULL CODE Family Communication:  Disposition Plan: medical bed - SNF recommended as long period will be required for recovery   Consultants:  none  Objective: Blood pressure (!) 106/35, pulse 66, temperature 98.2 F (36.8 C), temperature source Oral, resp.  rate 18, height 5\' 9"  (1.753 m), weight 62.3 kg, SpO2 96 %.  Intake/Output Summary (Last 24 hours) at 01/07/2019 0728 Last data filed at 01/07/2019 0646 Gross per 24 hour  Intake 720 ml  Output 650 ml  Net 70 ml   Filed Weights   01/01/2019 0533  Weight: 62.3 kg    Examination: General: No acute respiratory distress - appear dehydrated   Lungs: fine diffuse crackles  Cardiovascular: RRR  Abdomen: NT/ND, soft, BS positive Extremities: No edema B LE    CBC: Recent Labs  Lab 01/01/19 0510 01/02/19 0100 01/06/19 0119  WBC 7.2 7.7 10.9*  NEUTROABS 5.7  --   --   HGB 13.0 12.8* 14.7  HCT 38.7* 37.7* 42.6  MCV 95.3 94.5 94.7  PLT 411* 361 [id number]   Basic Metabolic Panel: Recent Labs  Lab 01/01/19 0510 01/02/19 0100  NA 138 138  K 3.9 4.3  CL 106 105  CO2 23 23  GLUCOSE 77 80  BUN 21 25*  CREATININE 0.89 0.87  CALCIUM 7.9* 8.0*   GFR: Estimated Creatinine Clearance: 48.7 mL/min (by C-G formula based on SCr of 0.87 mg/dL).  Liver Function Tests: Recent Labs  Lab 01/01/19 0510  AST 40  ALT 24  ALKPHOS 66  BILITOT 0.9  PROT 5.6*  ALBUMIN 2.5*     Scheduled Meds: . apixaban  10 mg Oral Q12H   Followed by  . [START ON 01/10/2019] apixaban  5 mg Oral Q12H  . vitamin C  500 mg Oral Daily  . atorvastatin  10 mg Oral q1800  . Chlorhexidine Gluconate Cloth  6 each Topical Daily  .  dexamethasone  6 mg Oral Q24H  . docusate sodium  100 mg Oral Daily  . famotidine  10 mg Oral Daily  . gabapentin  100 mg Oral QHS  . Ipratropium-Albuterol  1 puff Inhalation Q6H  . levothyroxine  50 mcg Oral QAC breakfast  . PARoxetine  20 mg Oral q morning - 10a  . zinc sulfate  220 mg Oral Daily      LOS: 9 days   Cherene Altes, MD [facility] Office  [phone] Pager - Text Page per Shea Evans  If 7PM-7AM, please contact night-coverage per Amion 01/07/2019, 7:28 AM

## 2019-01-07 NOTE — Progress Notes (Signed)
Called by gvc supervisor regarding consult.  Explained that someone would respond in a.m.  Pt. Not receiving iv fluids or meds.  MD note for PICC in a.m.

## 2019-01-07 NOTE — Care Plan (Signed)
Page from [employer] requesting diuretics.  Chart reviewed.  No indication for diuretics at this time.  Iv loss.  PICC ordered for am.

## 2019-01-07 NOTE — Progress Notes (Signed)
Spoke with patient's daughter and gave update.  Answered all questions.

## 2019-01-08 ENCOUNTER — Inpatient Hospital Stay (HOSPITAL_COMMUNITY): Payer: Medicare Other

## 2019-01-08 ENCOUNTER — Inpatient Hospital Stay: Payer: Self-pay

## 2019-01-08 LAB — COMPREHENSIVE METABOLIC PANEL
ALT: 26 U/L (ref 0–44)
AST: 35 U/L (ref 15–41)
Albumin: 3.1 g/dL — ABNORMAL LOW (ref 3.5–5.0)
Alkaline Phosphatase: 82 U/L (ref 38–126)
Anion gap: 25 — ABNORMAL HIGH (ref 5–15)
BUN: 34 mg/dL — ABNORMAL HIGH (ref 8–23)
CO2: 15 mmol/L — ABNORMAL LOW (ref 22–32)
Calcium: 8.7 mg/dL — ABNORMAL LOW (ref 8.9–10.3)
Chloride: 94 mmol/L — ABNORMAL LOW (ref 98–111)
Creatinine, Ser: 2 mg/dL — ABNORMAL HIGH (ref 0.61–1.24)
GFR calc Af Amer: 33 mL/min — ABNORMAL LOW (ref >60)
GFR calc non Af Amer: 28 mL/min — ABNORMAL LOW (ref >60)
Glucose, Bld: 256 mg/dL — ABNORMAL HIGH (ref 70–99)
Potassium: 4.3 mmol/L (ref 3.5–5.1)
Sodium: 134 mmol/L — ABNORMAL LOW (ref 135–145)
Total Bilirubin: 2.1 mg/dL — ABNORMAL HIGH (ref 0.3–1.2)
Total Protein: 6.6 g/dL (ref 6.5–8.1)

## 2019-01-08 LAB — CBC
HCT: 48.6 % (ref 39.0–52.0)
Hemoglobin: 16.1 g/dL (ref 13.0–17.0)
MCH: 32.6 pg (ref 26.0–34.0)
MCHC: 33.1 g/dL (ref 30.0–36.0)
MCV: 98.4 fL (ref 80.0–100.0)
Platelets: 268 10*3/uL (ref 150–400)
RBC: 4.94 MIL/uL (ref 4.22–5.81)
RDW: 14.6 % (ref 11.5–15.5)
WBC: 53.5 10*3/uL (ref 4.0–10.5)
nRBC: 0 % (ref 0.0–0.2)

## 2019-01-08 MED ORDER — SODIUM CHLORIDE 0.9 % IV SOLN: INTRAVENOUS | Status: DC

## 2019-01-08 MED ORDER — SODIUM CHLORIDE 0.9% FLUSH
10.0000 mL | Freq: Two times a day (BID) | INTRAVENOUS | Status: DC
  Administered 2019-01-08 – 2019-01-09 (×2): 10 mL

## 2019-01-08 MED ORDER — SODIUM CHLORIDE 0.9% FLUSH: 10.0000 mL | INTRAVENOUS | Status: DC | PRN

## 2019-01-08 MED ORDER — SODIUM CHLORIDE 0.9 % IV BOLUS
500.0000 mL | Freq: Once | INTRAVENOUS | Status: AC
  Administered 2019-01-08: 500 mL via INTRAVENOUS

## 2019-01-08 MED ORDER — DEXTROSE-NACL 5-0.9 % IV SOLN: INTRAVENOUS | Status: DC

## 2019-01-08 NOTE — Progress Notes (Signed)
CRITICAL VALUE ALERT  Critical Value:  WBC 53.5  Date & Time Notied: 01/08/19 0630 Provider Notified: Dr. Shanon Brow  Orders Received/Actions taken: Awaiting new orders

## 2019-01-08 NOTE — Progress Notes (Signed)
Spoke with Dr. Thereasa Solo, okay for midline placement for access.

## 2019-01-08 NOTE — Progress Notes (Signed)
Physical Therapy Treatment Patient Details Name: Lance [name] MRN: [id number] DOB: 02-28-1925 Today's Date: 01/08/2019    History of Present Illness 86 year old with a history of CAD, CVA, HLD, paroxysmal atrial fibrillation, MDS, and hypothyroidism who lives independently and presented to [facility] 12/25 with shortness of breath for several days.    PT Comments    Therapist arrived in pt room to find his mask had fallen off but he was still satting at 92% as per monitor, he also did not appear to be in  Distress. Pt agreeable to sitting edge of bed and completing breathing exercises, mod assist to get from supine to sit and min assist for initial few minutes to acclimate. Pt able to sit supported approx 5 mins max, but has difficulty with maintaining sats in 80s, saturation fluctuates between high 70s to low 90s, pt was on 15L/min via NRB. Therapist changed finger probe with better fitting one and also replaced NRB with better fitting one and sats continued to fluctuate. Pt was able to stand briefly with mod assist and RW and take 2-3 side steps to reposition in bed. Once back in supine pt looks exhausted, but saturations increased and stayed in high 80s and into 90s. DC recommendation may need revision as pt will need increased supervision at that time.     Follow Up Recommendations  SNF;Supervision/Assistance - 24 hour     Equipment Recommendations  None recommended by PT    Recommendations for Other Services       Precautions / Restrictions Precautions Precautions: Fall Precaution Comments: 02 desaturation with activity Restrictions Weight Bearing Restrictions: No    Mobility  Bed Mobility Overal bed mobility: Needs Assistance Bed Mobility: Rolling;Supine to Sit;Sit to Supine Rolling: Min assist   Supine to sit: Mod assist Sit to supine: Mod assist      Transfers Overall transfer level: Needs assistance Equipment used: Rolling walker (2  wheeled) Transfers: Sit to/from Stand Sit to Stand: Mod assist            Ambulation/Gait             General Gait Details: did not attempt today only took few steps at edge of bed to reposition in bed   Stairs             Wheelchair Mobility    Modified Rankin (Stroke Patients Only)       Balance Overall balance assessment: Needs assistance Sitting-balance support: Feet supported;Bilateral upper extremity supported Sitting balance-Leahy Scale: Fair   Postural control: Posterior lean;Right lateral lean Standing balance support: Bilateral upper extremity supported Standing balance-Leahy Scale: Poor                              Cognition Arousal/Alertness: Lethargic Behavior During Therapy: WFL for tasks assessed/performed Overall Cognitive Status: No family/caregiver present to determine baseline cognitive functioning                                        Exercises      General Comments        Pertinent Vitals/Pain Pain Assessment: Faces Faces Pain Scale: Hurts a little bit Pain Location: with increased work of breathing Pain Descriptors / Indicators: Grimacing Pain Intervention(s): Limited activity within patient's tolerance    Home Living  Prior Function            PT Goals (current goals can now be found in the care plan section) Acute Rehab PT Goals PT Goal Formulation: With patient Time For Goal Achievement: 01/15/19 Potential to Achieve Goals: Fair Progress towards PT goals: Not progressing toward goals - comment    Frequency    Min 3X/week      PT Plan Discharge plan needs to be updated    Co-evaluation              AM-PAC PT "6 Clicks" Mobility   Outcome Measure  Help needed turning from your back to your side while in a flat bed without using bedrails?: A Little Help needed moving from lying on your back to sitting on the side of a flat bed without  using bedrails?: A Lot Help needed moving to and from a bed to a chair (including a wheelchair)?: A Lot Help needed standing up from a chair using your arms (e.g., wheelchair or bedside chair)?: A Lot Help needed to walk in hospital room?: A Lot Help needed climbing 3-5 steps with a railing? : Total 6 Click Score: 12    End of Session Equipment Utilized During Treatment: Oxygen Activity Tolerance: Treatment limited secondary to medical complications (Comment);Patient limited by fatigue;Patient limited by lethargy Patient left: in bed;with call bell/phone within reach Nurse Communication: Mobility status PT Visit Diagnosis: Other abnormalities of gait and mobility (R26.89);Muscle weakness (generalized) (M62.81)     Time: [id number] PT Time Calculation (min) (ACUTE ONLY): 24 min  Charges:  $Therapeutic Activity: 23-37 mins                     Horald Chestnut, PT    Delford Field 01/08/2019, 4:01 PM

## 2019-01-08 NOTE — Progress Notes (Signed)
SLP Cancellation Note  Patient Details Name: Lance [name] MRN: [id number] DOB: 06/30/1925   Cancelled treatment:       Reason Eval/Treat Not Completed: Medical issues which prohibited therapy. Attempted MBS, after transition to [facility] chair pt could not sustain O2 saturations despite 15 L via NRB and [place]. Sats dropped to 67.  No POs given, pt returned to bed. MD at bedside on arrival. Will make pt NPO except meds at this time and f/u for readiness. Hopeful for improvement after IV hydration.   Herbie Baltimore, MA CCC-SLP  Acute Rehabilitation Services Pager [phone] Office [phone]  Lynann Beaver 01/08/2019, 1:05 PM

## 2019-01-08 NOTE — Progress Notes (Signed)
Lance [name]  [id number] DOB: 04-01-1925 DOA: 12/23/2018 PCP: Ernestene Kiel, MD    Brief Narrative:  86 year old with a history of CAD, CVA, HLD, paroxysmal atrial fibrillation, MDS, and hypothyroidism who lives independently and presented to [facility] 12/24 with shortness of breath for several days.  Significant Events: 12/26 admit to [facility] via [place] ED 12/31 profound desaturation working with therapy  COVID-19 specific Treatment: Decadron 12/24 > 1/4 Remdesivir 12/24 > 12/28  Antimicrobials:  Levaquin 12/24 > 12/28  Subjective: Developed increasing oxygen requirements last night.  Continues to do poorly with swallowing per SLP eval with MBS pending.  Clinical picture today revealing significant dehydration, possible overlying bacterial pneumonia, and acute kidney injury which appears to be prerenal.  He is awake and conversant but appears very tired.  I had a very frank discussion with the patient concerning his profoundly deconditioned state and ongoing medical problems and my concern that if he were to decline further it would not be appropriate to place him on the ventilator or put him through CPR.  Nonetheless he has made it clear to me that he would want [place] to try this. He will remain full code for now, but should he continue to decline further we will need to reassess as intubation/coding appears likely to be futile.   Assessment & Plan:  COVID Pneumonia -acute hypoxic respiratory failure completed a course of remdesivir and decadron - continues to require significant oxygen support with exertion, and now more so even at rest - attempt to wean oxygen as able - will clearly require a prolonged period for recovery - f/u CXR pending today - I suspect low level ongoing aspiration contributing  Probable dysphagia SLP following - for MBS when more strong - NPO for now - placing PICC today to hydrate   Acute renal failure  crt markedly increased -  likely prerenal - foley in place w/ no evidence of obstruction - consider [place] if crt does not improve w/ hydration   L popliteal DVT Confirmed on venous duplex - cont oral anticoag  Markedly elevated WBC ?combination of aspiration pneumonitis + dehydration - hydrate - checking CXR  CAD No acute issues  HTN Blood pressure controlled  HLD Continue home medical therapy  Paroxysmal atrial fibrillation NSR today - unclear why patient not on outpt, anticoag (?advanced age - lives alone) - will need a 32mo course of anticoag for DVT   Myelodysplastic syndrome Counts stable on CBC  Hypothyroidism Continue home medical therapy  History of CVA  DVT prophylaxis: Lovenox Code Status: FULL CODE Family Communication:  Disposition Plan: medical bed - SNF recommended as long period will be required for recovery   Consultants:  none  Objective: Blood pressure 99/62, pulse 91, temperature 97.6 F (36.4 C), temperature source Axillary, resp. rate 20, height 5\' 9"  (1.753 m), weight 62.3 kg, SpO2 94 %.  Intake/Output Summary (Last 24 hours) at 01/08/2019 0934 Last data filed at 01/08/2019 0900 Gross per 24 hour  Intake 310 ml  Output 250 ml  Net 60 ml   Filed Weights   12/07/2018 0533  Weight: 62.3 kg    Examination: General: No acute respiratory distress - appears dehydrated - appears very tired  Lungs: fine diffuse crackles without signif change  Cardiovascular: RRR - no M or rub  Abdomen: NT/ND, soft, BS positive Extremities: No edema B LE    CBC: Recent Labs  Lab 01/02/19 0100 01/06/19 0119 01/08/19 0240  WBC 7.7 10.9* 53.5*  HGB  12.8* 14.7 16.1  HCT 37.7* 42.6 48.6  MCV 94.5 94.7 98.4  PLT 361 236 [id number]   Basic Metabolic Panel: Recent Labs  Lab 01/02/19 0100 01/08/19 0240  NA 138 134*  K 4.3 4.3  CL 105 94*  CO2 23 15*  GLUCOSE 80 256*  BUN 25* 34*  CREATININE 0.87 2.00*  CALCIUM 8.0* 8.7*   GFR: Estimated Creatinine Clearance: 21.2 mL/min (A) (by C-G  formula based on SCr of 2 mg/dL (H)).  Liver Function Tests: Recent Labs  Lab 01/08/19 0240  AST 35  ALT 26  ALKPHOS 82  BILITOT 2.1*  PROT 6.6  ALBUMIN 3.1*     Scheduled Meds: . apixaban  10 mg Oral Q12H   Followed by  . [START ON 01/10/2019] apixaban  5 mg Oral Q12H  . vitamin C  500 mg Oral Daily  . atorvastatin  10 mg Oral q1800  . Chlorhexidine Gluconate Cloth  6 each Topical Daily  . docusate sodium  100 mg Oral Daily  . famotidine  10 mg Oral Daily  . gabapentin  100 mg Oral QHS  . Ipratropium-Albuterol  1 puff Inhalation Q6H  . levothyroxine  50 mcg Oral QAC breakfast  . PARoxetine  20 mg Oral q morning - 10a  . zinc sulfate  220 mg Oral Daily      LOS: 10 days   Cherene Altes, MD [facility] Office  [phone] Pager - Text Page per Shea Evans  If 7PM-7AM, please contact night-coverage per Amion 01/08/2019, 9:34 AM

## 2019-01-09 LAB — COMPREHENSIVE METABOLIC PANEL
ALT: 16 U/L (ref 0–44)
AST: 24 U/L (ref 15–41)
Albumin: 2.5 g/dL — ABNORMAL LOW (ref 3.5–5.0)
Alkaline Phosphatase: 89 U/L (ref 38–126)
Anion gap: 13 (ref 5–15)
BUN: 57 mg/dL — ABNORMAL HIGH (ref 8–23)
CO2: 22 mmol/L (ref 22–32)
Calcium: 7.7 mg/dL — ABNORMAL LOW (ref 8.9–10.3)
Chloride: 104 mmol/L (ref 98–111)
Creatinine, Ser: 2.3 mg/dL — ABNORMAL HIGH (ref 0.61–1.24)
GFR calc Af Amer: 28 mL/min — ABNORMAL LOW (ref >60)
GFR calc non Af Amer: 24 mL/min — ABNORMAL LOW (ref >60)
Glucose, Bld: 163 mg/dL — ABNORMAL HIGH (ref 70–99)
Potassium: 5.1 mmol/L (ref 3.5–5.1)
Sodium: 139 mmol/L (ref 135–145)
Total Bilirubin: 1.2 mg/dL (ref 0.3–1.2)
Total Protein: 5.4 g/dL — ABNORMAL LOW (ref 6.5–8.1)

## 2019-01-09 LAB — CBC
HCT: 42.6 % (ref 39.0–52.0)
Hemoglobin: 14.7 g/dL (ref 13.0–17.0)
MCH: 32.8 pg (ref 26.0–34.0)
MCHC: 34.5 g/dL (ref 30.0–36.0)
MCV: 95.1 fL (ref 80.0–100.0)
Platelets: 193 10*3/uL (ref 150–400)
RBC: 4.48 MIL/uL (ref 4.22–5.81)
RDW: 15 % (ref 11.5–15.5)
WBC: 36.7 10*3/uL — ABNORMAL HIGH (ref 4.0–10.5)
nRBC: 0 % (ref 0.0–0.2)

## 2019-01-09 LAB — MAGNESIUM: Magnesium: 2 mg/dL (ref 1.7–2.4)

## 2019-01-09 MED ORDER — ONDANSETRON 4 MG PO TBDP: 4.0000 mg | ORAL_TABLET | Freq: Four times a day (QID) | ORAL | Status: DC | PRN

## 2019-01-09 MED ORDER — GLYCOPYRROLATE 1 MG PO TABS
1.0000 mg | ORAL_TABLET | ORAL | Status: DC | PRN
  Filled 2019-01-09: qty 1

## 2019-01-09 MED ORDER — GLYCOPYRROLATE 0.2 MG/ML IJ SOLN: 0.2000 mg | INTRAMUSCULAR | Status: DC | PRN

## 2019-01-09 MED ORDER — POLYVINYL ALCOHOL 1.4 % OP SOLN
1.0000 [drp] | Freq: Four times a day (QID) | OPHTHALMIC | Status: DC | PRN
  Filled 2019-01-09: qty 15

## 2019-01-09 MED ORDER — HALOPERIDOL LACTATE 5 MG/ML IJ SOLN: 0.5000 mg | INTRAMUSCULAR | Status: DC | PRN

## 2019-01-09 MED ORDER — BIOTENE DRY MOUTH MT LIQD: 15.0000 mL | OROMUCOSAL | Status: DC | PRN

## 2019-01-09 MED ORDER — FENTANYL CITRATE (PF) 100 MCG/2ML IJ SOLN: 25.0000 ug | INTRAMUSCULAR | Status: DC | PRN

## 2019-01-09 MED ORDER — HALOPERIDOL LACTATE 2 MG/ML PO CONC
0.5000 mg | ORAL | Status: DC | PRN
  Filled 2019-01-09: qty 0.3

## 2019-01-09 MED ORDER — ONDANSETRON HCL 4 MG/2ML IJ SOLN: 4.0000 mg | Freq: Four times a day (QID) | INTRAMUSCULAR | Status: DC | PRN

## 2019-01-09 MED ORDER — SODIUM CHLORIDE 0.9 % IV BOLUS
500.0000 mL | Freq: Once | INTRAVENOUS | Status: AC
  Administered 2019-01-09: 09:00:00 500 mL via INTRAVENOUS

## 2019-01-09 MED ORDER — LORAZEPAM 2 MG/ML PO CONC: 1.0000 mg | ORAL | Status: DC | PRN

## 2019-01-09 MED ORDER — LORAZEPAM 2 MG/ML IJ SOLN: 1.0000 mg | INTRAMUSCULAR | Status: DC | PRN

## 2019-01-09 MED ORDER — LORAZEPAM 0.5 MG PO TABS: 1.0000 mg | ORAL_TABLET | ORAL | Status: DC | PRN

## 2019-01-09 MED ORDER — HALOPERIDOL 0.5 MG PO TABS
0.5000 mg | ORAL_TABLET | ORAL | Status: DC | PRN
  Filled 2019-01-09: qty 1

## 2019-02-04 NOTE — Progress Notes (Signed)
Received call from telemetry about pt O2 sats down in the 50's. Upon entering room, noticed RT in the room. Pt saturations in the 50's-60's. HR WNL. RRRN called as well as Bonner Puna, MD notified about pt condition. Pt unresponsive. On 15L HF and 15L NRB. RRRN called family about making pt a DNR. Bonner Puna, MD also spoke with family and family agreed to make pt DNR. Pt placed on comfort care. Facetimed with family so they could communicate with pt. After facetime call, pt O2 mask removed. Time of pt death, 1235-04-23. MD and family notified.

## 2019-02-04 NOTE — Progress Notes (Signed)
Pt had episode of bloody stool and coffee ground colored emesis. Pt also c/o SOB. Pt was on 15L NRB, addityional 15L HF placed at this time. A&Ox4. Bonner Puna, MD notified about pt condition. See new orders. Will continue to monitor pt

## 2019-02-04 NOTE — Progress Notes (Signed)
[name] updated. All questions answered

## 2019-02-04 NOTE — Death Summary Note (Signed)
DEATH SUMMARY   Patient Details  Name: Lance [name] MRN: [id number] DOB: 12-06-1925  Admission/Discharge Information   Admit Date:  2019-01-22  Date of Death: Date of Death: 02-Feb-2019  Time of Death: Time of Death: 1235/04/21  Length of Stay: Apr 07, 2022  Referring Physician: Ernestene Kiel, MD   Reason(s) for Hospitalization  Respiratory failure  Diagnoses  Preliminary cause of death: Acute hypoxic respiratory failure due to aspiration pneumonitis in setting of covid-19 pneumonia Secondary Diagnoses (including complications and co-morbidities):  Principal Problem:   Acute hypoxemic respiratory failure due to COVID-19 [facility]) Active Problems:   Coronary artery disease involving native heart   Essential hypertension   Hyperlipidemia   PAF (paroxysmal atrial fibrillation) ([place])   Myelodysplasia (myelodysplastic syndrome) ([place])   Hypothyroid   Elevated brain natriuretic peptide (BNP) level   Brief Hospital Course (including significant findings, care, treatment, and services provided and events leading to death)  Lance [name] is a 86 y.o. year old male with a history of CAD, CVA, HLD, PAF, MDS, and hypothyroidism who presented to [facility] 12/24 with covid-19 pneumonia and new oxygen requirement, 5LPM. CTA chest was negative for PE, demonstrated interstitial pneumonitis and findings consistent with viral pneumonia. He was treated with remdesivir and steroids, transferred to [facility] on Jan 22, 2023 treated with remdesivir and decadron with improvement in hypoxia, though he remained profoundly deconditioned and weak diffusely. He continued to have poor per oral intake. He developed worsening hypoxia on 12/30-12/31. Repeat CXR showed interval improvement, but aspiration suspected by previous hospitalist and speech therapy consultant, made NPO. Despite being given IV fluids, the patient developed acute renal failure and worsening acute metabolic encephalopathy. Hypoxia progressed, requiring  15L O2 and with suspected ongoing aspiration, severe leukocytosis (WBC 10 > 53), goals of care discussions were undertaken. On 1/6 the patient became abruptly profoundly hypoxic and encephalopathic, unresponsive. This is suspected to be due to an aspiration event. The family was contacted and desired comfort measures only. The patient expired shortly thereafter.   Pertinent Labs and Studies  Significant Diagnostic Studies DG Chest Port 1 View  Result Date: 01/08/2019 CLINICAL DATA:  Hypoxia EXAM: PORTABLE CHEST 1 VIEW COMPARISON:  2019/01/22 FINDINGS: There are persistent multifocal airspace opacities bilaterally, slightly improved from prior study, especially at the right lung base. There is no pneumothorax. No large pleural effusion. The heart size is stable. Aortic calcifications are noted. There is no acute osseous abnormality. IMPRESSION: Persistent bilateral airspace opacities with slight interval improvement. No pneumothorax. Electronically Signed   By: Constance Holster M.D.   On: 01/08/2019 16:24   DG CHEST PORT 1 VIEW  Result Date: 2019/01/22 CLINICAL DATA:  Acute hypoxic respiratory failure due to COVID-19. EXAM: PORTABLE CHEST 1 VIEW COMPARISON:  12/27/2018 FINDINGS: Lungs are hypoinflated as lordotic technique is demonstrated. Exam demonstrates persistent hazy patchy bilateral airspace process over the mid to lower lungs likely multifocal pneumonia in this COVID-19 patient. No effusion. Mild stable cardiomegaly. Remainder of the exam is unchanged. IMPRESSION: Stable hazy bilateral airspace process compatible with multifocal pneumonia in this COVID-19 positive patient. Stable cardiomegaly. Electronically Signed   By: Marin Olp M.D.   On: 01-22-2019 10:22   VAS [place] LOWER EXTREMITY VENOUS (DVT)  Result Date: 01/02/2019  Lower Venous Study Indications: Elevated d dimer.  Comparison Study: no prior Performing Technologist: Abram Sander RVS  Examination Guidelines: A complete  evaluation includes B-mode imaging, spectral Doppler, color Doppler, and power Doppler as needed of all accessible portions of each vessel. Bilateral  testing is considered an integral part of a complete examination. Limited examinations for reoccurring indications may be performed as noted.  +---------+---------------+---------+-----------+----------+--------------+ RIGHT    CompressibilityPhasicitySpontaneityPropertiesThrombus Aging +---------+---------------+---------+-----------+----------+--------------+ CFV      Full           Yes      Yes                                 +---------+---------------+---------+-----------+----------+--------------+ SFJ      Full                                                        +---------+---------------+---------+-----------+----------+--------------+ FV Prox  Full                                                        +---------+---------------+---------+-----------+----------+--------------+ FV Mid   Full                                                        +---------+---------------+---------+-----------+----------+--------------+ FV DistalFull                                                        +---------+---------------+---------+-----------+----------+--------------+ PFV      Full                                                        +---------+---------------+---------+-----------+----------+--------------+ POP      Full           Yes      Yes                                 +---------+---------------+---------+-----------+----------+--------------+ PTV      Full                                                        +---------+---------------+---------+-----------+----------+--------------+ PERO     Full                                                        +---------+---------------+---------+-----------+----------+--------------+    +---------+---------------+---------+-----------+----------+-----------------+ LEFT     CompressibilityPhasicitySpontaneityPropertiesThrombus Aging    +---------+---------------+---------+-----------+----------+-----------------+ CFV      Full  Yes      Yes                                    +---------+---------------+---------+-----------+----------+-----------------+ SFJ      Full                                                           +---------+---------------+---------+-----------+----------+-----------------+ FV Prox  Full                                                           +---------+---------------+---------+-----------+----------+-----------------+ FV Mid   Full                                                           +---------+---------------+---------+-----------+----------+-----------------+ FV DistalFull                                                           +---------+---------------+---------+-----------+----------+-----------------+ PFV      Full                                                           +---------+---------------+---------+-----------+----------+-----------------+ POP      Partial        Yes      Yes                  Age Indeterminate +---------+---------------+---------+-----------+----------+-----------------+ PTV      Full                                                           +---------+---------------+---------+-----------+----------+-----------------+ PERO                                                  Not visualized    +---------+---------------+---------+-----------+----------+-----------------+     Summary: Right: There is no evidence of deep vein thrombosis in the lower extremity. No cystic structure found in the popliteal fossa. Left: Findings consistent with age indeterminate deep vein thrombosis involving the left popliteal vein. No cystic structure found in the popliteal  fossa.  *See table(s) above for measurements and observations. Electronically signed by Monica Martinez MD on 01/02/2019 at 5:35:30 PM.    Final  [place] EKG SITE RITE  Result Date: 01/08/2019 If Site Rite image not attached, placement could not be confirmed due to current cardiac rhythm.  Lab Basic Metabolic Panel: Recent Labs  Lab 01/08/19 0240 01-17-2019 0500  NA 134* 139  K 4.3 5.1  CL 94* 104  CO2 15* 22  GLUCOSE 256* 163*  BUN 34* 57*  CREATININE 2.00* 2.30*  CALCIUM 8.7* 7.7*  MG  --  2.0   Liver Function Tests: Recent Labs  Lab 01/08/19 0240 01-17-19 0500  AST 35 24  ALT 26 16  ALKPHOS 82 89  BILITOT 2.1* 1.2  PROT 6.6 5.4*  ALBUMIN 3.1* 2.5*   CBC: Recent Labs  Lab 01/06/19 0119 01/08/19 0240 2019/01/17 0500  WBC 10.9* 53.5* 36.7*  HGB 14.7 16.1 14.7  HCT 42.6 48.6 42.6  MCV 94.7 98.4 95.1  PLT 236 268 193   Sepsis Labs: Recent Labs  Lab 01/06/19 0119 01/08/19 0240 17-Jan-2019 0500  WBC 10.9* 53.5* 36.7*    Procedures/Operations  None   Patrecia Pour 01/12/2019, 4:34 PM

## 2019-02-04 NOTE — Progress Notes (Signed)
  Speech Language Pathology Treatment: Dysphagia  Patient Details Name: Lance [name] MRN: [id number] DOB: 1925-04-16 Today's Date: 2019/01/24 Time: 1120-1130 SLP Time Calculation (min) (ACUTE ONLY): 10 min  Assessment / Plan / Recommendation Clinical Impression  Checked in with pt today, alert on SLP arrival, requested ice. SLP removed denture, cleaned lips, repositioned pt. Pt orally accepted ice, but did not swallow, liquid removed from his mouth. Pt much less responsive at the end of session. Advised RN to have suction present if any POs are offered. After session complete at the time of this documentation noted that pts SLP orders were discontinued by MD and pt made DNR. Will sign off.   HPI HPI: 86 year old with a history of CAD, CVA, HLD, paroxysmal atrial fibrillation, MDS, and hypothyroidism who lives independently and presented to [facility] 12/25 with shortness of breath for several days. Dx with COVID pna and transferred to [place].       SLP Plan  Discharge SLP treatment due to (comment)       Recommendations                   Plan: Discharge SLP treatment due to (comment)       GO                Faysal Fenoglio, Katherene Ponto Jan 24, 2019, 2:50 PM

## 2019-02-04 DEATH — deceased

## 2020-02-04 DEATH — deceased

## 2021-09-03 IMAGING — DX DG CHEST 1V PORT
1 series · 1 of 1 positions shown · non-contrast
Comparison: 12/27/2018

CLINICAL DATA: Acute hypoxic respiratory failure due to PAY9E-UZ.

EXAM:
PORTABLE CHEST 1 VIEW

[chest]
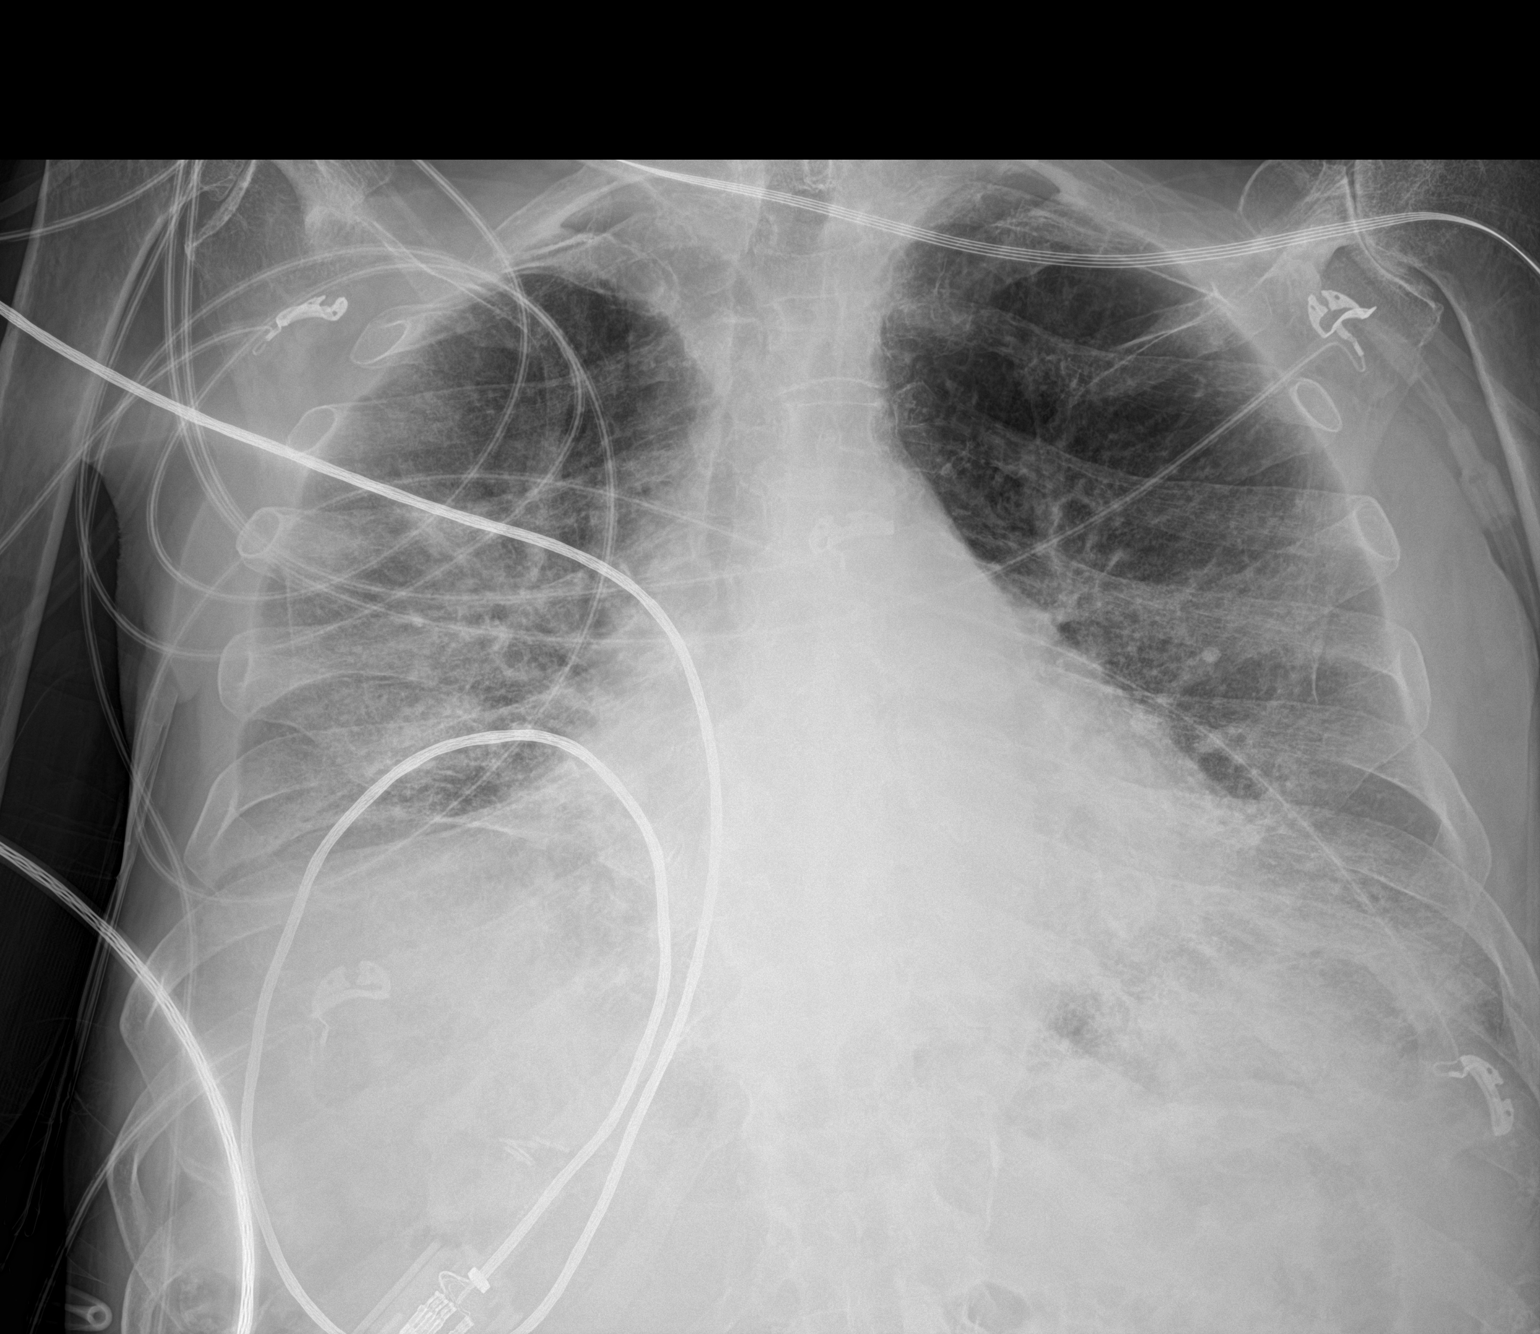

[1 of 1 positions shown; findings below may reference images not displayed]

FINDINGS: Lungs are hypoinflated as lordotic technique is demonstrated. Exam
demonstrates persistent hazy patchy bilateral airspace process over
the mid to lower lungs likely multifocal pneumonia in this PAY9E-UZ
patient. No effusion. Mild stable cardiomegaly. Remainder of the
exam is unchanged.
IMPRESSION: Stable hazy bilateral airspace process compatible with multifocal
pneumonia in this PAY9E-UZ positive patient.

Stable cardiomegaly.

## 2021-09-13 IMAGING — DX DG CHEST 1V PORT
1 series · 1 of 1 positions shown · non-contrast
Comparison: December 29, 2018

CLINICAL DATA: Hypoxia

EXAM:
PORTABLE CHEST 1 VIEW

[chest]
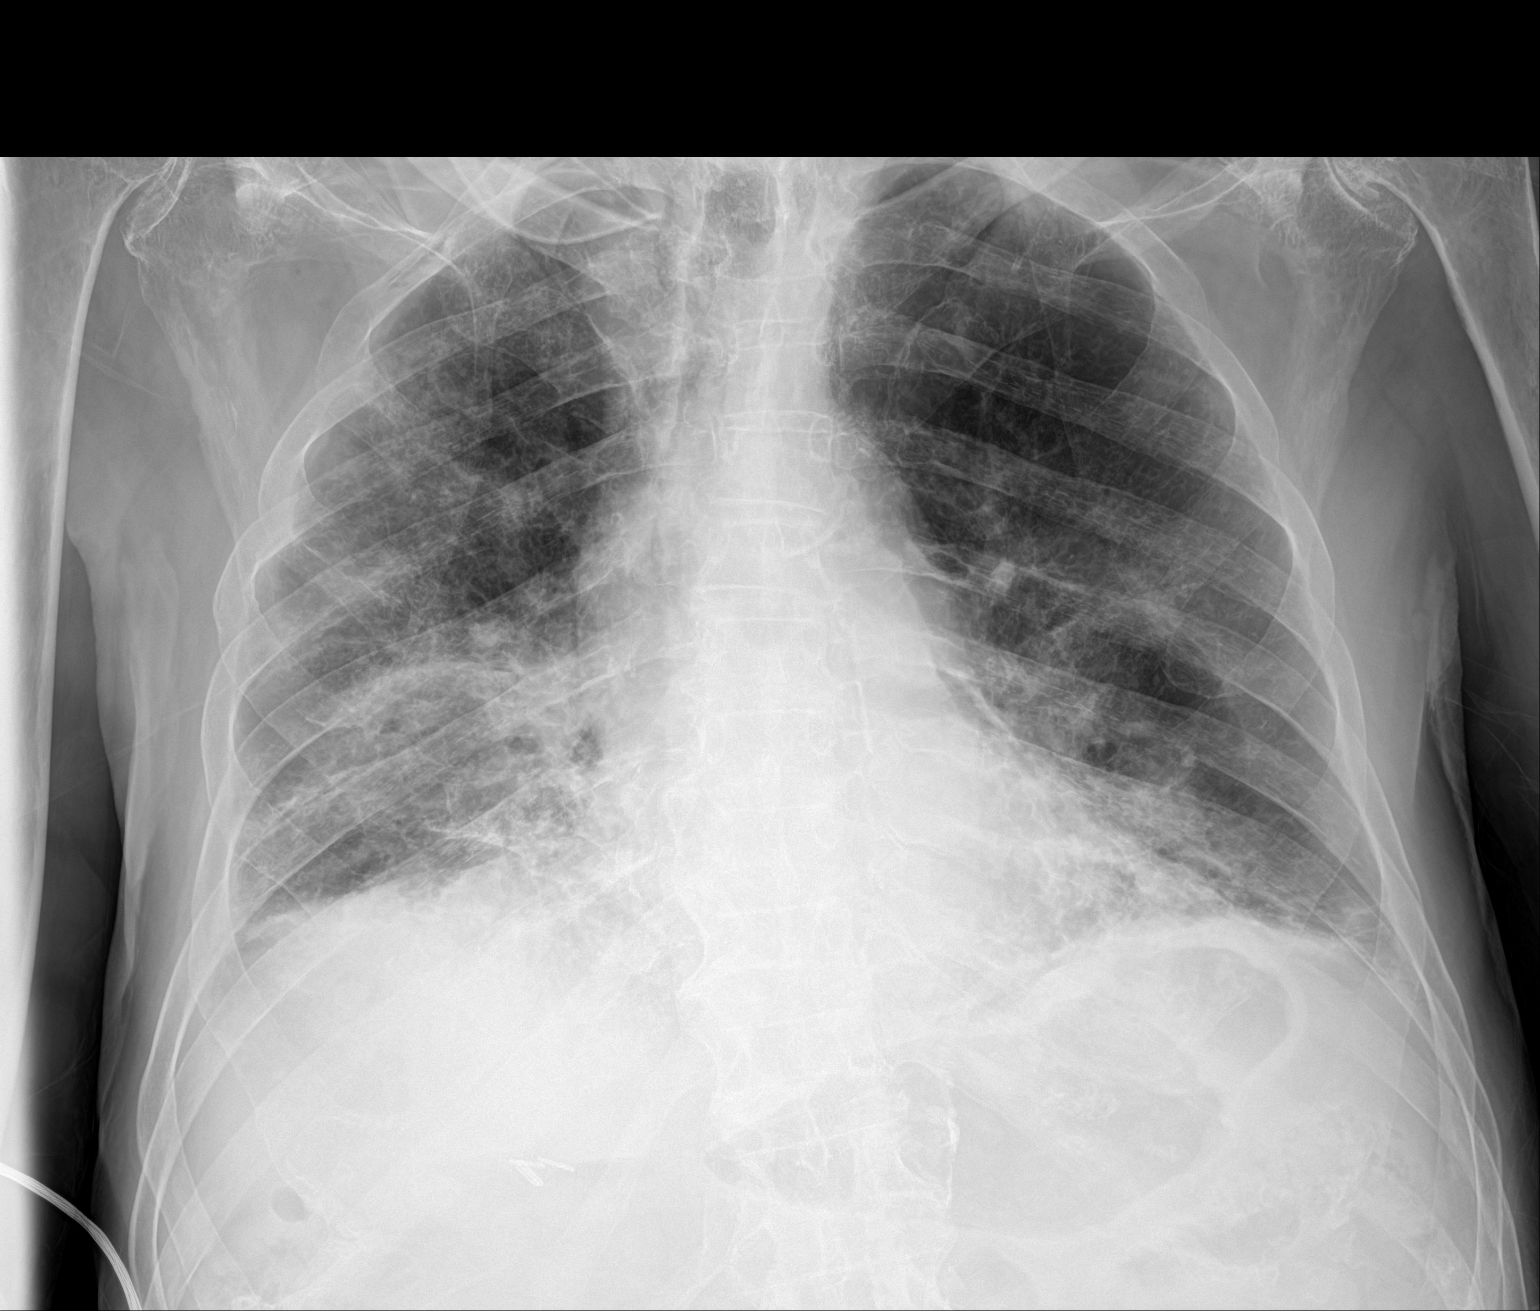

[1 of 1 positions shown; findings below may reference images not displayed]

FINDINGS: There are persistent multifocal airspace opacities bilaterally,
slightly improved from prior study, especially at the right lung
base. There is no pneumothorax. No large pleural effusion. The heart
size is stable. Aortic calcifications are noted. There is no acute
osseous abnormality.
IMPRESSION: Persistent bilateral airspace opacities with slight interval
improvement. No pneumothorax.
